# Patient Record
Sex: Male | Born: 1975 | Race: White | Hispanic: No | Marital: Married | Smoking: Current every day smoker
Health system: Southern US, Community
[De-identification: ages and names within clinical notes are randomized; demographics above are authoritative.]

## PROBLEM LIST (undated history)

## (undated) DIAGNOSIS — K7460 Unspecified cirrhosis of liver: Secondary | ICD-10-CM

## (undated) DIAGNOSIS — F1020 Alcohol dependence, uncomplicated: Secondary | ICD-10-CM

## (undated) DIAGNOSIS — I4891 Unspecified atrial fibrillation: Secondary | ICD-10-CM

## (undated) HISTORY — PX: PERITONEOCENTESIS: SHX417

---

## 2012-10-11 ENCOUNTER — Emergency Department: Payer: Self-pay | Admitting: Emergency Medicine

## 2012-10-11 LAB — COMPREHENSIVE METABOLIC PANEL
Alkaline Phosphatase: 156 U/L — ABNORMAL HIGH (ref 50–136)
Anion Gap: 9 (ref 7–16)
BUN: 6 mg/dL — ABNORMAL LOW (ref 7–18)
Bilirubin,Total: 0.4 mg/dL (ref 0.2–1.0)
Chloride: 105 mmol/L (ref 98–107)
Creatinine: 0.63 mg/dL (ref 0.60–1.30)
Osmolality: 272 (ref 275–301)
Potassium: 3.5 mmol/L (ref 3.5–5.1)
Total Protein: 7.5 g/dL (ref 6.4–8.2)

## 2012-10-11 LAB — ACETAMINOPHEN LEVEL: Acetaminophen: <2 ug/mL

## 2012-10-11 LAB — DRUG SCREEN, URINE
Amphetamines, Ur Screen: NEGATIVE (ref ?–1000)
Benzodiazepine, Ur Scrn: NEGATIVE (ref ?–200)
Cannabinoid 50 Ng, Ur ~~LOC~~: NEGATIVE (ref ?–50)
Cocaine Metabolite,Ur ~~LOC~~: NEGATIVE (ref ?–300)
MDMA (Ecstasy)Ur Screen: NEGATIVE (ref ?–500)
Methadone, Ur Screen: NEGATIVE (ref ?–300)
Tricyclic, Ur Screen: NEGATIVE (ref ?–1000)

## 2012-10-11 LAB — SALICYLATE LEVEL: Salicylates, Serum: 3.3 mg/dL — ABNORMAL HIGH

## 2012-10-11 LAB — CBC
HCT: 49.6 % (ref 40.0–52.0)
MCH: 34.8 pg — ABNORMAL HIGH (ref 26.0–34.0)
MCHC: 35 g/dL (ref 32.0–36.0)
Platelet: 259 10*3/uL (ref 150–440)
RDW: 14.8 % — ABNORMAL HIGH (ref 11.5–14.5)
WBC: 11.3 10*3/uL — ABNORMAL HIGH (ref 3.8–10.6)

## 2012-10-11 LAB — ETHANOL
Ethanol %: 0.046 % (ref 0.000–0.080)
Ethanol: 46 mg/dL

## 2012-10-11 LAB — TSH: Thyroid Stimulating Horm: 2.63 u[IU]/mL

## 2013-07-29 ENCOUNTER — Ambulatory Visit: Payer: Self-pay | Admitting: Surgery

## 2017-08-21 ENCOUNTER — Inpatient Hospital Stay (HOSPITAL_BASED_OUTPATIENT_CLINIC_OR_DEPARTMENT_OTHER)
Admission: EM | Admit: 2017-08-21 | Discharge: 2017-08-22 | DRG: 433 | Discharge status: Against Medical Advice | Payer: Medicaid Other | Attending: Family Medicine | Admitting: Family Medicine

## 2017-08-21 ENCOUNTER — Encounter (HOSPITAL_BASED_OUTPATIENT_CLINIC_OR_DEPARTMENT_OTHER): Payer: Self-pay | Admitting: Adult Health

## 2017-08-21 ENCOUNTER — Other Ambulatory Visit: Payer: Self-pay

## 2017-08-21 ENCOUNTER — Emergency Department (HOSPITAL_BASED_OUTPATIENT_CLINIC_OR_DEPARTMENT_OTHER): Payer: Medicaid Other

## 2017-08-21 DIAGNOSIS — K7031 Alcoholic cirrhosis of liver with ascites: Secondary | ICD-10-CM | POA: Diagnosis present

## 2017-08-21 DIAGNOSIS — Z590 Homelessness: Secondary | ICD-10-CM | POA: Diagnosis not present

## 2017-08-21 DIAGNOSIS — F17200 Nicotine dependence, unspecified, uncomplicated: Secondary | ICD-10-CM | POA: Diagnosis present

## 2017-08-21 DIAGNOSIS — I4891 Unspecified atrial fibrillation: Secondary | ICD-10-CM | POA: Diagnosis present

## 2017-08-21 DIAGNOSIS — R1084 Generalized abdominal pain: Secondary | ICD-10-CM

## 2017-08-21 DIAGNOSIS — R443 Hallucinations, unspecified: Secondary | ICD-10-CM | POA: Diagnosis present

## 2017-08-21 DIAGNOSIS — F10232 Alcohol dependence with withdrawal with perceptual disturbance: Secondary | ICD-10-CM | POA: Diagnosis present

## 2017-08-21 DIAGNOSIS — R109 Unspecified abdominal pain: Secondary | ICD-10-CM | POA: Diagnosis present

## 2017-08-21 DIAGNOSIS — F10932 Alcohol use, unspecified with withdrawal with perceptual disturbance: Secondary | ICD-10-CM

## 2017-08-21 DIAGNOSIS — F10951 Alcohol use, unspecified with alcohol-induced psychotic disorder with hallucinations: Secondary | ICD-10-CM

## 2017-08-21 DIAGNOSIS — E785 Hyperlipidemia, unspecified: Secondary | ICD-10-CM | POA: Diagnosis present

## 2017-08-21 DIAGNOSIS — E871 Hypo-osmolality and hyponatremia: Secondary | ICD-10-CM | POA: Diagnosis present

## 2017-08-21 DIAGNOSIS — Z7952 Long term (current) use of systemic steroids: Secondary | ICD-10-CM | POA: Diagnosis not present

## 2017-08-21 DIAGNOSIS — Z5321 Procedure and treatment not carried out due to patient leaving prior to being seen by health care provider: Secondary | ICD-10-CM | POA: Diagnosis present

## 2017-08-21 DIAGNOSIS — Z79899 Other long term (current) drug therapy: Secondary | ICD-10-CM

## 2017-08-21 DIAGNOSIS — D72829 Elevated white blood cell count, unspecified: Secondary | ICD-10-CM | POA: Diagnosis present

## 2017-08-21 DIAGNOSIS — K824 Cholesterolosis of gallbladder: Secondary | ICD-10-CM | POA: Diagnosis present

## 2017-08-21 DIAGNOSIS — R000 Tachycardia, unspecified: Secondary | ICD-10-CM

## 2017-08-21 DIAGNOSIS — R112 Nausea with vomiting, unspecified: Secondary | ICD-10-CM

## 2017-08-21 DIAGNOSIS — K709 Alcoholic liver disease, unspecified: Secondary | ICD-10-CM | POA: Diagnosis present

## 2017-08-21 DIAGNOSIS — R1011 Right upper quadrant pain: Secondary | ICD-10-CM

## 2017-08-21 HISTORY — DX: Unspecified cirrhosis of liver: K74.60

## 2017-08-21 HISTORY — DX: Unspecified atrial fibrillation: I48.91

## 2017-08-21 HISTORY — DX: Alcohol dependence, uncomplicated: F10.20

## 2017-08-21 LAB — URINALYSIS, ROUTINE W REFLEX MICROSCOPIC
Bilirubin Urine: NEGATIVE
Glucose, UA: NEGATIVE mg/dL
Hgb urine dipstick: NEGATIVE
Ketones, ur: NEGATIVE mg/dL
Leukocytes, UA: NEGATIVE
Nitrite: NEGATIVE
Protein, ur: NEGATIVE mg/dL
Specific Gravity, Urine: <1.005 — ABNORMAL LOW (ref 1.005–1.030)
pH: 6.5 (ref 5.0–8.0)

## 2017-08-21 LAB — CBC WITH DIFFERENTIAL/PLATELET
Basophils Absolute: 0 10*3/uL (ref 0.0–0.1)
Basophils Relative: 0 %
Eosinophils Absolute: 0.2 10*3/uL (ref 0.0–0.7)
Eosinophils Relative: 1 %
HCT: 40.2 % (ref 39.0–52.0)
Hemoglobin: 15.3 g/dL (ref 13.0–17.0)
Lymphocytes Relative: 9 %
Lymphs Abs: 2 10*3/uL (ref 0.7–4.0)
MCH: 34.7 pg — ABNORMAL HIGH (ref 26.0–34.0)
MCHC: 37.8 g/dL — ABNORMAL HIGH (ref 30.0–36.0)
MCV: 91.2 fL (ref 78.0–100.0)
Monocytes Absolute: 1.3 10*3/uL — ABNORMAL HIGH (ref 0.1–1.0)
Monocytes Relative: 6 %
Neutro Abs: 18.8 10*3/uL — ABNORMAL HIGH (ref 1.7–7.7)
Neutrophils Relative %: 84 %
Platelets: 243 10*3/uL (ref 150–400)
RBC: 4.41 MIL/uL (ref 4.22–5.81)
RDW: 13.8 % (ref 11.5–15.5)
WBC: 22.3 10*3/uL — ABNORMAL HIGH (ref 4.0–10.5)

## 2017-08-21 LAB — TSH: TSH: 2.725 u[IU]/mL (ref 0.350–4.500)

## 2017-08-21 LAB — COMPREHENSIVE METABOLIC PANEL
ALT: 26 U/L (ref 17–63)
AST: 69 U/L — ABNORMAL HIGH (ref 15–41)
Albumin: 2.9 g/dL — ABNORMAL LOW (ref 3.5–5.0)
Alkaline Phosphatase: 441 U/L — ABNORMAL HIGH (ref 38–126)
Anion gap: 14 (ref 5–15)
BUN: 6 mg/dL (ref 6–20)
CO2: 21 mmol/L — ABNORMAL LOW (ref 22–32)
Calcium: 7.9 mg/dL — ABNORMAL LOW (ref 8.9–10.3)
Chloride: 91 mmol/L — ABNORMAL LOW (ref 101–111)
Creatinine, Ser: 0.76 mg/dL (ref 0.61–1.24)
GFR calc Af Amer: >60 mL/min (ref >60)
GFR calc non Af Amer: >60 mL/min (ref >60)
Glucose, Bld: 156 mg/dL — ABNORMAL HIGH (ref 65–99)
Potassium: 3.5 mmol/L (ref 3.5–5.1)
Sodium: 126 mmol/L — ABNORMAL LOW (ref 135–145)
Total Bilirubin: 2.6 mg/dL — ABNORMAL HIGH (ref 0.3–1.2)
Total Protein: 6.6 g/dL (ref 6.5–8.1)

## 2017-08-21 LAB — RAPID URINE DRUG SCREEN, HOSP PERFORMED
Amphetamines: NOT DETECTED
Barbiturates: NOT DETECTED
Benzodiazepines: NOT DETECTED
Cocaine: NOT DETECTED
Opiates: NOT DETECTED
Tetrahydrocannabinol: NOT DETECTED

## 2017-08-21 LAB — PROTIME-INR
INR: 1.32
Prothrombin Time: 16.2 seconds — ABNORMAL HIGH (ref 11.4–15.2)

## 2017-08-21 LAB — LIPASE, BLOOD: Lipase: 34 U/L (ref 11–51)

## 2017-08-21 LAB — AMMONIA: Ammonia: 48 umol/L — ABNORMAL HIGH (ref 9–35)

## 2017-08-21 LAB — TROPONIN I: Troponin I: <0.03 ng/mL (ref <0.03)

## 2017-08-21 MED ORDER — CHLORDIAZEPOXIDE HCL 25 MG PO CAPS
50.0000 mg | ORAL_CAPSULE | Freq: Once | ORAL | Status: AC
  Administered 2017-08-21: 50 mg via ORAL
  Filled 2017-08-21: qty 2

## 2017-08-21 MED ORDER — VITAMIN B-1 100 MG PO TABS
100.0000 mg | ORAL_TABLET | Freq: Every day | ORAL | Status: DC
  Administered 2017-08-21 – 2017-08-22 (×2): 100 mg via ORAL
  Filled 2017-08-21 (×2): qty 1

## 2017-08-21 MED ORDER — TRAMADOL HCL 50 MG PO TABS: 50.0000 mg | ORAL_TABLET | Freq: Four times a day (QID) | ORAL | Status: DC | PRN

## 2017-08-21 MED ORDER — SODIUM CHLORIDE 0.9 % IV BOLUS
1000.0000 mL | Freq: Once | INTRAVENOUS | Status: AC
  Administered 2017-08-21: 1000 mL via INTRAVENOUS

## 2017-08-21 MED ORDER — MAGNESIUM OXIDE 400 MG PO TABS: 400.0000 mg | ORAL_TABLET | Freq: Every day | ORAL | Status: DC

## 2017-08-21 MED ORDER — SODIUM CHLORIDE 0.9% FLUSH
3.0000 mL | Freq: Two times a day (BID) | INTRAVENOUS | Status: DC
  Administered 2017-08-21 – 2017-08-22 (×2): 3 mL via INTRAVENOUS

## 2017-08-21 MED ORDER — ADULT MULTIVITAMIN W/MINERALS CH
1.0000 | ORAL_TABLET | Freq: Every day | ORAL | Status: DC
  Administered 2017-08-21 – 2017-08-22 (×2): 1 via ORAL
  Filled 2017-08-21 (×2): qty 1

## 2017-08-21 MED ORDER — THIAMINE HCL 100 MG/ML IJ SOLN: 100.0000 mg | Freq: Every day | INTRAMUSCULAR | Status: DC

## 2017-08-21 MED ORDER — LORAZEPAM 2 MG/ML IJ SOLN: 0.0000 mg | Freq: Two times a day (BID) | INTRAMUSCULAR | Status: DC

## 2017-08-21 MED ORDER — FUROSEMIDE 40 MG PO TABS
40.0000 mg | ORAL_TABLET | Freq: Every day | ORAL | Status: DC
  Administered 2017-08-22: 40 mg via ORAL
  Filled 2017-08-21 (×2): qty 1

## 2017-08-21 MED ORDER — MAGNESIUM OXIDE 400 (241.3 MG) MG PO TABS
400.0000 mg | ORAL_TABLET | Freq: Every day | ORAL | Status: DC
  Administered 2017-08-22: 400 mg via ORAL
  Filled 2017-08-21 (×2): qty 1

## 2017-08-21 MED ORDER — LORAZEPAM 2 MG/ML IJ SOLN: 2.0000 mg | Freq: Once | INTRAMUSCULAR | Status: DC

## 2017-08-21 MED ORDER — SODIUM CHLORIDE 0.9 % IV SOLN
INTRAVENOUS | Status: AC
  Administered 2017-08-21: 21:00:00 via INTRAVENOUS

## 2017-08-21 MED ORDER — LORAZEPAM 2 MG/ML IJ SOLN
0.0000 mg | Freq: Four times a day (QID) | INTRAMUSCULAR | Status: DC
  Administered 2017-08-21: 2 mg via INTRAVENOUS
  Filled 2017-08-21: qty 1

## 2017-08-21 MED ORDER — ONDANSETRON HCL 4 MG/2ML IJ SOLN: 4.0000 mg | Freq: Four times a day (QID) | INTRAMUSCULAR | Status: DC | PRN

## 2017-08-21 MED ORDER — LORAZEPAM 2 MG/ML IJ SOLN: 1.0000 mg | Freq: Four times a day (QID) | INTRAMUSCULAR | Status: DC | PRN

## 2017-08-21 MED ORDER — ONDANSETRON HCL 4 MG/2ML IJ SOLN
INTRAMUSCULAR | Status: AC
  Administered 2017-08-21: 4 mg
  Filled 2017-08-21: qty 2

## 2017-08-21 MED ORDER — FOLIC ACID 1 MG PO TABS
1.0000 mg | ORAL_TABLET | Freq: Every day | ORAL | Status: DC
  Administered 2017-08-21 – 2017-08-22 (×2): 1 mg via ORAL
  Filled 2017-08-21 (×2): qty 1

## 2017-08-21 MED ORDER — CHLORDIAZEPOXIDE HCL 25 MG PO CAPS: 25.0000 mg | ORAL_CAPSULE | Freq: Every day | ORAL | Status: DC

## 2017-08-21 MED ORDER — SODIUM CHLORIDE 0.9% FLUSH: 3.0000 mL | INTRAVENOUS | Status: DC | PRN

## 2017-08-21 MED ORDER — SPIRONOLACTONE 25 MG PO TABS
50.0000 mg | ORAL_TABLET | Freq: Every day | ORAL | Status: DC
  Administered 2017-08-22: 50 mg via ORAL
  Filled 2017-08-21 (×2): qty 2

## 2017-08-21 MED ORDER — SODIUM CHLORIDE 0.9 % IV SOLN: 250.0000 mL | INTRAVENOUS | Status: DC | PRN

## 2017-08-21 MED ORDER — THIAMINE HCL 100 MG/ML IJ SOLN
100.0000 mg | Freq: Every day | INTRAMUSCULAR | Status: DC
  Administered 2017-08-21: 100 mg via INTRAVENOUS
  Filled 2017-08-21: qty 2

## 2017-08-21 MED ORDER — VITAMIN B-1 100 MG PO TABS: 100.0000 mg | ORAL_TABLET | Freq: Every day | ORAL | Status: DC

## 2017-08-21 MED ORDER — PANTOPRAZOLE SODIUM 40 MG PO TBEC
40.0000 mg | DELAYED_RELEASE_TABLET | Freq: Two times a day (BID) | ORAL | Status: DC
  Administered 2017-08-21 – 2017-08-22 (×2): 40 mg via ORAL
  Filled 2017-08-21 (×2): qty 1

## 2017-08-21 MED ORDER — CARVEDILOL 6.25 MG PO TABS
6.2500 mg | ORAL_TABLET | Freq: Two times a day (BID) | ORAL | Status: DC
  Administered 2017-08-22: 6.25 mg via ORAL
  Filled 2017-08-21: qty 1

## 2017-08-21 MED ORDER — LORAZEPAM 1 MG PO TABS: 0.0000 mg | ORAL_TABLET | Freq: Two times a day (BID) | ORAL | Status: DC

## 2017-08-21 MED ORDER — LORAZEPAM 1 MG PO TABS: 1.0000 mg | ORAL_TABLET | Freq: Four times a day (QID) | ORAL | Status: DC | PRN

## 2017-08-21 MED ORDER — LORAZEPAM 2 MG/ML IJ SOLN: 0.0000 mg | Freq: Four times a day (QID) | INTRAMUSCULAR | Status: DC

## 2017-08-21 MED ORDER — LORAZEPAM 1 MG PO TABS: 0.0000 mg | ORAL_TABLET | Freq: Four times a day (QID) | ORAL | Status: DC

## 2017-08-21 MED ORDER — PREDNISONE 20 MG PO TABS: 20.0000 mg | ORAL_TABLET | Freq: Every day | ORAL | Status: DC

## 2017-08-21 MED ORDER — ENOXAPARIN SODIUM 40 MG/0.4ML ~~LOC~~ SOLN
40.0000 mg | SUBCUTANEOUS | Status: DC
  Filled 2017-08-21: qty 0.4

## 2017-08-21 NOTE — ED Triage Notes (Signed)
Presents with Cirrhosis and abdominal pain, nausea, vomiting and alcohol withdrawal. This began last night. He reprots he was recently dx with cirrhosis and has had ascites and varices. He had pericentesis 3 times, last one was 3 weeks ago. HE feels distended in his abdomen. HE is diaphoretic and shaky. His last drink was 2 am Wednesday. He usually drinks 10-15 drinks of liquor a day. [facility]. HR is 110, BP 148/107. Tremors noted. Endorses hallucinations.

## 2017-08-21 NOTE — H&P (Addendum)
TRH H&P   Patient Demographics:    Nirvan Laban, is a 42 y.o. male  MRN: [id number]   DOB - 1975/09/11  Admit Date - 08/21/2017  Outpatient Primary MD for the patient is Patient, No Pcp Per  Referring MD/NP/PA:  Eliezer Mccoy  Outpatient Specialists:      [facility]: [facility]  Chief Complaint  Patient presents with  . Alcohol Intoxication  . Abdominal Pain      HPI:    Corbet Hanley  is a 42 y.o. male, w ETOH abuse, Pafib (per pt), Cirrhosis, ascites apparently c/o abdominal pain over the past week, diffuse, worse today and therefore presented to ED. Pt notes that he had n/v, but no hematemesis.  Pt denies fever, chillls, diarrhea, constipation, brbpr, black stool.  Pt didn't try anything in particular for his pain.  Food apparently makes things slightly worse.   In ED,  Abdominal ultrasound IMPRESSION: Gallbladder sludge is noted without evidence of cholelithiasis. Small 3 mm gallbladder polyp is noted. No biliary dilatation is noted.  Findings consistent with hepatic cirrhosis.  Mild ascites.  Na 126, K 3.5, Bun 6, Creatinine 0.76,  Ast 69, Alt 26, alk phos 441, T. Bili 2.6  Wbc 22.3, Hgb 15.3, Plt 243 Lipase 34,  Trop <0.03 INR 1.32 Ammonia 48 UDS negative Urinalysis negative  CXR pending / ordered  Pt will be admitted for evaluation of abdominal pain, n/v, and ascites and leukocytosis.      Review of systems:    In addition to the HPI above,    No Fever-chills, No Headache, No changes with Vision or hearing, No problems swallowing food or Liquids, No Chest pain, Cough or Shortness of Breath, No Blood in stool or Urine, No dysuria, No new skin rashes or bruises, No new joints pains-aches,  No new weakness, tingling, numbness in any extremity, No recent weight gain or loss, No polyuria, polydypsia or polyphagia, No  significant Mental Stressors.  A full 10 point Review of Systems was done, except as stated above, all other Review of Systems were negative.   With Past History of the following :    Past Medical History:  Diagnosis Date  . Alcoholism ([place])   . Atrial fibrillation ([place])   . Cirrhosis [facility])       Past Surgical History:  Procedure Laterality Date  . PERITONEOCENTESIS        Social History:     Social History   Tobacco Use  . Smoking status: Current Every Day Smoker  . Smokeless tobacco: Current User  Substance Use Topics  . Alcohol use: Yes    Comment: 15 liquor drinks per day     Lives - previously with mother, now homeless and staying with friends  Mobility - walks by self   Family History :     Family History  Problem Relation Age  of Onset  . Atrial fibrillation Father       Home Medications:   Prior to Admission medications   Medication Sig Start Date End Date Taking? Authorizing Provider  carvedilol (COREG) 6.25 MG tablet Take 6.25 mg by mouth 2 (two) times daily with a meal.   Yes [provider]  ferrous sulfate 325 (65 FE) MG tablet Take by mouth. 07/12/17 07/12/18 Yes [provider]  furosemide (LASIX) 40 MG tablet Take by mouth. 07/12/17 07/12/18 Yes [provider]  magnesium oxide (MAG-OX) 400 MG tablet Take by mouth. 07/11/17 07/11/18 Yes [provider]  ondansetron (ZOFRAN) 4 MG tablet Take by mouth. 06/19/17  Yes [provider]  pantoprazole (PROTONIX) 40 MG tablet Take 40 mg by mouth daily.   Yes [provider]  pravastatin (PRAVACHOL) 10 MG tablet Take by mouth. 09/12/16 09/12/17 Yes [provider]  predniSONE (DELTASONE) 20 MG tablet Take 20 mg by mouth daily with breakfast.   Yes [provider]  spironolactone (ALDACTONE) 50 MG tablet Take by mouth. 07/12/17  Yes [provider]  metoprolol tartrate (LOPRESSOR) 25 MG tablet Take by mouth. 10/08/16   [provider]  niacin 500 MG tablet Take by mouth.    [provider]     Allergies:    No Known Allergies   Physical Exam:   Vitals  Blood pressure (!) 142/96, pulse (!) 104, temperature (!) 97.4 F (36.3 C), temperature source Oral, resp. rate 20, height _0  (1.854 m), weight 99.8 kg (220 lb), SpO2 92 %.   1. General  lying in bed in NAD,    2. Normal affect and insight, Not Suicidal or Homicidal, Awake Alert, Oriented X 3.  3. No F.N deficits, ALL C.Nerves Intact, Strength 5/5 all 4 extremities, Sensation intact all 4 extremities, Plantars down going.  4. Ears and Eyes appear Normal, Conjunctivae clear, PERRLA. Moist Oral Mucosa.  5. Supple Neck, No JVD, No cervical lymphadenopathy appriciated, No Carotid Bruits.  6. Symmetrical Chest wall movement, Good air movement bilaterally, CTAB.  7. RRR, No Gallops, Rubs or Murmurs, No Parasternal Heave.  8. Positive Bowel Sounds, Abdomen Soft, No tenderness, No organomegaly appriciated,No rebound -guarding or rigidity.  9.  No Cyanosis, Normal Skin Turgor, No Skin Rash or Bruise.  10. Good muscle tone,  joints appear normal , no effusions, Normal ROM.  11. No Palpable Lymph Nodes in Neck or Axillae  No palmar erythema, no asterixis, no caput medusa   Data Review:    CBC Recent Labs  Lab 08/21/17 1029  WBC 22.3*  HGB 15.3  HCT 40.2  PLT 243  MCV 91.2  MCH 34.7*  MCHC 37.8*  RDW 13.8  LYMPHSABS 2.0  MONOABS 1.3*  EOSABS 0.2  BASOSABS 0.0   ------------------------------------------------------------------------------------------------------------------  Chemistries  Recent Labs  Lab 08/21/17 1029  NA 126*  K 3.5  CL 91*  CO2 21*  GLUCOSE 156*  BUN 6  CREATININE 0.76  CALCIUM 7.9*  AST 69*  ALT 26  ALKPHOS 441*  BILITOT 2.6*   ------------------------------------------------------------------------------------------------------------------ estimated creatinine clearance is 149.6 mL/min  (by C-G formula based on SCr of 0.76 mg/dL). ------------------------------------------------------------------------------------------------------------------ No results for input(s): TSH, T4TOTAL, T3FREE, THYROIDAB in the last 72 hours.  Invalid input(s): FREET3  Coagulation profile Recent Labs  Lab 08/21/17 1038  INR 1.32   ------------------------------------------------------------------------------------------------------------------- No results for input(s): DDIMER in the last 72 hours. -------------------------------------------------------------------------------------------------------------------  Cardiac Enzymes Recent Labs  Lab 08/21/17 1029  TROPONINI <0.03   ------------------------------------------------------------------------------------------------------------------  No results found for: BNP   ---------------------------------------------------------------------------------------------------------------  Urinalysis    Component Value Date/Time   COLORURINE YELLOW 08/21/2017 [place] 08/21/2017 1343   LABSPEC <1.005 (L) 08/21/2017 1343   PHURINE 6.5 08/21/2017 1343   GLUCOSEU NEGATIVE 08/21/2017 1343   HGBUR NEGATIVE 08/21/2017 1343   BILIRUBINUR NEGATIVE 08/21/2017 1343   KETONESUR NEGATIVE 08/21/2017 1343   PROTEINUR NEGATIVE 08/21/2017 1343   NITRITE NEGATIVE 08/21/2017 1343   LEUKOCYTESUR NEGATIVE 08/21/2017 1343    ----------------------------------------------------------------------------------------------------------------   Imaging Results:    US Abdomen Limited Ruq  Result Date: 08/21/2017 CLINICAL DATA:  Right upper quadrant abdominal pain. EXAM: ULTRASOUND ABDOMEN LIMITED RIGHT UPPER QUADRANT COMPARISON:  None. FINDINGS: Gallbladder: No cholelithiasis is noted. 3 mm gallbladder polyp is noted. No significant gallbladder wall thickening is noted. No sonographic Murphy's sign is noted. Mild amount of sludge is noted  within gallbladder lumen. Common bile duct: Diameter: 4 mm which is within normal limits. Liver: No focal lesion identified. Heterogeneous echotexture of hepatic parenchyma is noted with nodular contours. Portal vein is patent on color Doppler imaging with normal direction of blood flow towards the liver. Mild ascites is noted. IMPRESSION: Gallbladder sludge is noted without evidence of cholelithiasis. Small 3 mm gallbladder polyp is noted. No biliary dilatation is noted. Findings consistent with hepatic cirrhosis.  Mild ascites. Electronically Signed   By: Marijo Conception, M.D.   On: 08/21/2017 14:27       Assessment & Plan:    Active Problems:   Alcoholic cirrhosis of liver (HCC)   Hyponatremia   Leukocytosis   Tachycardia    Leukocytosis ? Due to n/v CXR Pa and Lateral Repeat cbc in am  Abdominal pain (diffuse) Start protonix 62m po bid Check CT abd/ pelvis  N/v Start Zofran 488miv q6h prn protonix 4019mo bid  Abnormal liver function  Check acute hepatitis panel Check GGT Consider MRCP due to elevation in alk phos STOP Pravastatin  Hyponatremia Check serum osm, cortisol , tsh Check urine osm, urine sodium Hydrate gently w ns at 50 mL per hour  Check cmp in am  Tachycardia ? Due to etoh withdrawal Tele Trop I q6h x3 Check tsh Consider echo if persistent    ETOH abuse Librium 68m58m qday x 1 days then 25mg29mqday x 1 day CIWA  Hyperlipidemia STOP Pravastatin  Pafib ? Per pt Tele   DVT Prophylaxis Lovenox - SCDs   AM Labs Ordered, also please review Full Orders  Family Communication: Admission, patients condition and plan of care including tests being ordered have been discussed with the patient  who indicate understanding and agree with the plan and Code Status.  Code Status  FULL CODE  Likely DC to  home  Condition GUARDED    Consults called: none  Admission status: inpatient   Time spent in minutes : 45   JamesJani Gravelon 08/21/2017 at  7:53 PM  Between 7am to 7pm - Pager - [phone] 7pm go to [email or web address] - password [email or web address] - Office  [phone]

## 2017-08-21 NOTE — ED Notes (Signed)
Gave patient sprite and cheese and crackers; no acute distress noted.

## 2017-08-21 NOTE — ED Provider Notes (Signed)
[facility] EMERGENCY DEPARTMENT Provider Note   CSN: [id number] Arrival date & time: 08/21/17  1001     History   Chief Complaint Chief Complaint  Patient presents with  . Alcohol Intoxication  . Abdominal Pain    HPI Kristopher Barker is a 42 y.o. male with history of alcoholism and alcoholic cirrhosis who presents with a 1 day history of vomiting and abdominal cramping.  Patient also has had tremors and hallucinations.  He reports that he has had seizures from withdrawal in the past, however none recently.  Patient has not been able to keep fluids down since yesterday.  He has had some associated shortness of breath.  He denies chest pain.  He is not currently taking Librium.  Patient's last drink was over 48 hours ago.  He is reportedly trying to cut back.  Patient has had paracentesis in the past.  He has noticed some abdominal distention worsening.  HPI  Past Medical History:  Diagnosis Date  . Alcoholism ([place])   . Cirrhosis ([place])     There are no active problems to display for this patient.   Past Surgical History:  Procedure Laterality Date  . PERITONEOCENTESIS          Home Medications    Prior to Admission medications   Medication Sig Start Date End Date Taking? Authorizing Provider  carvedilol (COREG) 6.25 MG tablet Take 6.25 mg by mouth 2 (two) times daily with a meal.   Yes [provider]  ferrous sulfate 325 (65 FE) MG tablet Take by mouth. 07/12/17 07/12/18 Yes [provider]  furosemide (LASIX) 40 MG tablet Take by mouth. 07/12/17 07/12/18 Yes [provider]  magnesium oxide (MAG-OX) 400 MG tablet Take by mouth. 07/11/17 07/11/18 Yes [provider]  ondansetron (ZOFRAN) 4 MG tablet Take by mouth. 06/19/17  Yes [provider]  pantoprazole (PROTONIX) 40 MG tablet Take 40 mg by mouth daily.   Yes [provider]  pravastatin (PRAVACHOL) 10 MG tablet Take by mouth. 09/12/16 09/12/17 Yes [provider]  predniSONE (DELTASONE) 20 MG tablet Take 20 mg by mouth daily with breakfast.   Yes [provider]  spironolactone (ALDACTONE) 50 MG tablet Take by mouth. 07/12/17  Yes [provider]  metoprolol tartrate (LOPRESSOR) 25 MG tablet Take by mouth. 10/08/16   [provider]  niacin 500 MG tablet Take by mouth.    [provider]    Family History History reviewed. No pertinent family history.  Social History Social History   Tobacco Use  . Smoking status: Current Every Day Smoker  . Smokeless tobacco: Current User  Substance Use Topics  . Alcohol use: Yes    Comment: 15 liquor drinks per day  . Drug use: Never     Allergies   Patient has no known allergies.   Review of Systems Review of Systems  Constitutional: Negative for chills and fever.  HENT: Negative for facial swelling and sore throat.   Respiratory: Positive for shortness of breath.   Cardiovascular: Negative for chest pain.  Gastrointestinal: Positive for abdominal pain, nausea and vomiting. Negative for diarrhea.  Genitourinary: Negative for dysuria.  Musculoskeletal: Negative for back pain.  Skin: Negative for rash and wound.  Neurological: Negative for seizures and headaches.  Psychiatric/Behavioral: Positive for hallucinations. The patient is not nervous/anxious.      Physical Exam Updated Vital Signs BP 134/88 (BP Location: Right Arm)   Pulse 96   Temp 98.4  F (36.9 C) (Oral)   Resp (!) 24   Ht _0  (1.854 m)   Wt 99.8 kg (220 lb)   SpO2 97%   BMI 29.03 kg/m   Physical Exam  Constitutional: He appears well-developed and well-nourished. No distress.  HENT:  Head: Normocephalic and atraumatic.  Mouth/Throat: Oropharynx is clear and moist. No oropharyngeal exudate.  Eyes: Pupils are equal, round, and reactive to light. Conjunctivae are normal. Right eye exhibits no discharge. Left eye exhibits no discharge. No scleral icterus.  Neck: Normal range  of motion. Neck supple. No thyromegaly present.  Cardiovascular: Normal rate, regular rhythm, normal heart sounds and intact distal pulses. Exam reveals no gallop and no friction rub.  No murmur heard. Pulmonary/Chest: Effort normal and breath sounds normal. No stridor. No respiratory distress. He has no wheezes. He has no rales.  Abdominal: Soft. Bowel sounds are normal. He exhibits no distension. There is no tenderness. There is no rebound and no guarding.  Musculoskeletal: He exhibits no edema.  Lymphadenopathy:    He has no cervical adenopathy.  Neurological: He is alert. Coordination normal.  Skin: Skin is warm and dry. No rash noted. He is not diaphoretic. No pallor.  Psychiatric: He has a normal mood and affect. He is actively hallucinating.  Nursing note and vitals reviewed.    ED Treatments / Results  Labs (all labs ordered are listed, but only abnormal results are displayed) Labs Reviewed  URINALYSIS, ROUTINE W REFLEX MICROSCOPIC - Abnormal; Notable for the following components:      Result Value   Specific Gravity, Urine <1.005 (*)    All other components within normal limits  COMPREHENSIVE METABOLIC PANEL - Abnormal; Notable for the following components:   Sodium 126 (*)    Chloride 91 (*)    CO2 21 (*)    Glucose, Bld 156 (*)    Calcium 7.9 (*)    Albumin 2.9 (*)    AST 69 (*)    Alkaline Phosphatase 441 (*)    Total Bilirubin 2.6 (*)    All other components within normal limits  CBC WITH DIFFERENTIAL/PLATELET - Abnormal; Notable for the following components:   WBC 22.3 (*)    MCH 34.7 (*)    MCHC 37.8 (*)    Neutro Abs 18.8 (*)    Monocytes Absolute 1.3 (*)    All other components within normal limits  PROTIME-INR - Abnormal; Notable for the following components:   Prothrombin Time 16.2 (*)    All other components within normal limits  AMMONIA - Abnormal; Notable for the following components:   Ammonia 48 (*)    All other components within normal limits    LIPASE, BLOOD  TROPONIN I  RAPID URINE DRUG SCREEN, HOSP PERFORMED    EKG EKG Interpretation  Date/Time:  Friday August 21 2017 10:56:55 EDT Ventricular Rate:  87 PR Interval:    QRS Duration: 106 QT Interval:  410 QTC Calculation: 494 R Axis:   90 Text Interpretation:  Sinus rhythm Atrial premature complex Borderline right axis deviation ST elev, probable normal early repol pattern Borderline prolonged QT interval When compared to prior, no signifiacnt changes seen.  No STEMI Confirmed by Antony Blackbird [phone]) on 08/21/2017 11:45:39 AM   Radiology US Abdomen Limited Ruq  Result Date: 08/21/2017 CLINICAL DATA:  Right upper quadrant abdominal pain. EXAM: ULTRASOUND ABDOMEN LIMITED RIGHT UPPER QUADRANT COMPARISON:  None. FINDINGS: Gallbladder: No cholelithiasis is noted. 3 mm gallbladder polyp is noted. No significant gallbladder wall thickening  is noted. No sonographic Murphy's sign is noted. Mild amount of sludge is noted within gallbladder lumen. Common bile duct: Diameter: 4 mm which is within normal limits. Liver: No focal lesion identified. Heterogeneous echotexture of hepatic parenchyma is noted with nodular contours. Portal vein is patent on color Doppler imaging with normal direction of blood flow towards the liver. Mild ascites is noted. IMPRESSION: Gallbladder sludge is noted without evidence of cholelithiasis. Small 3 mm gallbladder polyp is noted. No biliary dilatation is noted. Findings consistent with hepatic cirrhosis.  Mild ascites. Electronically Signed   By: Marijo Conception, M.D.   On: 08/21/2017 14:27    Procedures Procedures (including critical care time)  Medications Ordered in ED Medications  LORazepam (ATIVAN) injection 0-4 mg (2 mg Intravenous Given 08/21/17 1034)    Or  LORazepam (ATIVAN) tablet 0-4 mg ( Oral See Alternative 08/21/17 1034)  LORazepam (ATIVAN) injection 0-4 mg (has no administration in time range)    Or  LORazepam (ATIVAN) tablet 0-4 mg (has no  administration in time range)  thiamine (VITAMIN B-1) tablet 100 mg ( Oral See Alternative 08/21/17 1059)    Or  thiamine (B-1) injection 100 mg (100 mg Intravenous Given 08/21/17 1059)  LORazepam (ATIVAN) injection 2 mg (2 mg Intravenous Not Given 08/21/17 1058)  ondansetron (ZOFRAN) 4 MG/2ML injection (4 mg  Given 08/21/17 1034)  sodium chloride 0.9 % bolus 1,000 mL (0 mLs Intravenous Stopped 08/21/17 1146)     Initial Impression / Assessment and Plan / ED Course  I have reviewed the triage vital signs and the nursing notes.  Pertinent labs & imaging results that were available during my care of the patient were reviewed by me and considered in my medical decision making (see chart for details).     Patient with acute alcohol withdrawal.  Patient endorsing hallucinations.  Acute onset generalized abdominal pain and cramping with nausea and vomiting.  CBC shows leukocytosis of 22.3.  Per chart review, but this does seem to be typical for patient.  CMP shows sodium 126, chloride 91, AST 69, alk phos 441, total bilirubin 2.6, albumin 2.9.  Lipase 34.  Negative troponin.  RUQ Ultrasound shows gallbladder sludge without evidence of cholelithiasis, small 3 mm gallbladder polyp; findings consistent with hepatic cirrhosis, mild ascites.  Patient initiated with [facility] protocol and patient has been stable.  Considering hallucinations with acute withdrawal, will admit for further management.  I discussed patient case with Dr. Horris Latino at [facility] who accepts the patient.  I appreciate her assistance with the patient. I discussed patient case with my attending, Dr. Sherry Ruffing, who guided the patient's management and agrees with plan.   Final Clinical Impressions(s) / ED Diagnoses   Final diagnoses:  RUQ pain  Alcohol withdrawal syndrome with perceptual disturbance (HCC)  Generalized abdominal pain  Non-intractable vomiting with nausea, unspecified vomiting type  Hallucinations due to alcohol [facility])     ED Discharge Orders    None       Frederica Kuster, PA-C 08/21/17 1532    Tegeler, Gwenyth Allegra, MD 08/21/17 1547

## 2017-08-21 NOTE — ED Notes (Signed)
[facility] arrived to transport pt to [facility].  

## 2017-08-22 ENCOUNTER — Inpatient Hospital Stay (HOSPITAL_COMMUNITY): Payer: Medicaid Other

## 2017-08-22 DIAGNOSIS — R1084 Generalized abdominal pain: Secondary | ICD-10-CM

## 2017-08-22 DIAGNOSIS — K7031 Alcoholic cirrhosis of liver with ascites: Principal | ICD-10-CM

## 2017-08-22 DIAGNOSIS — R109 Unspecified abdominal pain: Secondary | ICD-10-CM

## 2017-08-22 DIAGNOSIS — F10232 Alcohol dependence with withdrawal with perceptual disturbance: Secondary | ICD-10-CM

## 2017-08-22 DIAGNOSIS — E871 Hypo-osmolality and hyponatremia: Secondary | ICD-10-CM

## 2017-08-22 DIAGNOSIS — D72829 Elevated white blood cell count, unspecified: Secondary | ICD-10-CM

## 2017-08-22 LAB — CORTISOL: CORTISOL PLASMA: 9.5 ug/dL

## 2017-08-22 LAB — OSMOLALITY: Osmolality: 278 mOsm/kg (ref 275–295)

## 2017-08-22 LAB — COMPREHENSIVE METABOLIC PANEL
ALK PHOS: 387 U/L — AB (ref 38–126)
ALT: 23 U/L (ref 17–63)
ANION GAP: 11 (ref 5–15)
AST: 53 U/L — ABNORMAL HIGH (ref 15–41)
Albumin: 2.4 g/dL — ABNORMAL LOW (ref 3.5–5.0)
BUN: 5 mg/dL — ABNORMAL LOW (ref 6–20)
CALCIUM: 8 mg/dL — AB (ref 8.9–10.3)
CHLORIDE: 101 mmol/L (ref 101–111)
CO2: 23 mmol/L (ref 22–32)
CREATININE: 0.78 mg/dL (ref 0.61–1.24)
Glucose, Bld: 92 mg/dL (ref 65–99)
Potassium: 3.4 mmol/L — ABNORMAL LOW (ref 3.5–5.1)
SODIUM: 135 mmol/L (ref 135–145)
Total Bilirubin: 2.5 mg/dL — ABNORMAL HIGH (ref 0.3–1.2)
Total Protein: 5.9 g/dL — ABNORMAL LOW (ref 6.5–8.1)

## 2017-08-22 LAB — GAMMA GT: GGT: 346 U/L — AB (ref 7–50)

## 2017-08-22 LAB — CBC
HCT: 39.2 % (ref 39.0–52.0)
HEMOGLOBIN: 13.5 g/dL (ref 13.0–17.0)
MCH: 33.7 pg (ref 26.0–34.0)
MCHC: 34.4 g/dL (ref 30.0–36.0)
MCV: 97.8 fL (ref 78.0–100.0)
Platelets: 164 10*3/uL (ref 150–400)
RBC: 4.01 MIL/uL — AB (ref 4.22–5.81)
RDW: 14.5 % (ref 11.5–15.5)
WBC: 14.3 10*3/uL — AB (ref 4.0–10.5)

## 2017-08-22 LAB — TROPONIN I
Troponin I: <0.03 ng/mL (ref <0.03)
Troponin I: <0.03 ng/mL (ref <0.03)

## 2017-08-22 LAB — HIV ANTIBODY (ROUTINE TESTING W REFLEX): HIV SCREEN 4TH GENERATION: NONREACTIVE

## 2017-08-22 MED ORDER — POTASSIUM CHLORIDE CRYS ER 20 MEQ PO TBCR
40.0000 meq | EXTENDED_RELEASE_TABLET | Freq: Once | ORAL | Status: AC
  Administered 2017-08-22: 40 meq via ORAL
  Filled 2017-08-22: qty 2

## 2017-08-22 MED ORDER — IOPAMIDOL (ISOVUE-300) INJECTION 61%
100.0000 mL | Freq: Once | INTRAVENOUS | Status: AC | PRN
  Administered 2017-08-22: 100 mL via INTRAVENOUS

## 2017-08-22 MED ORDER — BOOST / RESOURCE BREEZE PO LIQD CUSTOM: 1.0000 | Freq: Three times a day (TID) | ORAL | Status: DC

## 2017-08-22 MED ORDER — IOPAMIDOL (ISOVUE-300) INJECTION 61%
INTRAVENOUS | Status: AC
  Filled 2017-08-22: qty 30

## 2017-08-22 NOTE — Progress Notes (Signed)
Patient spoke with MD regarding test results and being discharged. All risks were explained to pt by MD. Nurse again explained risk and encouraged pt to stay for further work-up. He insisted he wanted to "leave, look over papers and get another opinion on what to do". AMA paper was signed by pt.Melton Alarana A Mamye Bolds, RN

## 2017-08-22 NOTE — Progress Notes (Signed)
[facility]  PROGRESS NOTE  Kristopher Barker OYD:[id number] DOB: December 28, 1975 DOA: 08/21/2017 PCP: Patient, No Pcp Per   Brief HPI:   42 y.o. male, w ETOH abuse, Pafib (per pt), Cirrhosis, ascites apparently c/o abdominal pain over the past week, diffuse, worse today and therefore presented to ED. Pt notes that he had n/v, but no hematemesis.  Pt denies fever, chillls, diarrhea, constipation, brbpr, black stool.  Pt didn't try anything in particular for his pain.  Food apparently makes things slightly worse.     Subjective   Patient seen and examined, denies abdominal pain.  No nausea vomiting.  WBC has improved to 14,000, he is afebrile.  AST is 53, ALT 23   Assessment/Plan:     1. Abdominal pain-resolved, CT abdomen pelvis has been ordered.  He does have elevated alk phos 387,  AST 53, ALT 23, total bilirubin 2.5. GGT is elevated 346.Patient does have history of liver cirrhosis, as seen on the abdominal ultrasound.  Will follow CT scan results.Consider MRCP based on the CT scan results 2. Leukocytosis-patient had elevated WBC 22.3 on admission, which is improved to 14.3.  Patient continues to be afebrile.  UA was negative 3. Hyponatremia-resolved, patient came with sodium of 126, it has improved to 135 with IV fluids. 4. Alcohol abuse-no signs or symptoms of withdrawal, continue CIWA protocol 5. Hyperlipidemia-pravastatin has been discontinued.    DVT prophylaxis: SCDs  Code Status: Full code  Family Communication: No family at bedside  Disposition Plan: likely home when medically ready for discharge   Consultants:  None  Procedures:  None   Antibiotics:   Anti-infectives (From admission, onward)   None       Objective   Vitals:   08/21/17 1824 08/21/17 2158 08/22/17 0641 08/22/17 0644  BP: (!) 142/96 (!) 130/95 123/84 123/84  Pulse: (!) 104 98 92 98  Resp: '20 18 20 20  ' Temp: (!) 97.4 F (36.3 C) 98.3 F (36.8 C) 98.2 F (36.8 C) 98.2 F (36.8  C)  TempSrc: Oral  Oral Oral  SpO2: 92% 98% 98% 93%  Weight:      Height:        Intake/Output Summary (Last 24 hours) at 08/22/2017 1401 Last data filed at 08/22/2017 6720 Gross per 24 hour  Intake 1352.5 ml  Output 100 ml  Net 1252.5 ml   Filed Weights   08/21/17 1013  Weight: 99.8 kg (220 lb)     Physical Examination:    General: Appears in no acute distress  Cardiovascular: S1-S2, regular, no murmurs auscultated  Respiratory: Clear to auscultation bilaterally, no wheezing or crackles  Abdomen: Soft, nontender, no organomegaly  Extremities: No edema noted in the lower extremities  Neurologic: Alert, oriented x 3, no focal deficit noted at this time     Data Reviewed: I have personally reviewed following labs and imaging studies  CBG: No results for input(s): GLUCAP in the last 168 hours.  CBC: Recent Labs  Lab 08/21/17 1029 08/22/17 0158  WBC 22.3* 14.3*  NEUTROABS 18.8*  --   HGB 15.3 13.5  HCT 40.2 39.2  MCV 91.2 97.8  PLT 243 947    Basic Metabolic Panel: Recent Labs  Lab 08/21/17 1029 08/22/17 0158  NA 126* 135  K 3.5 3.4*  CL 91* 101  CO2 21* 23  GLUCOSE 156* 92  BUN 6 5*  CREATININE 0.76 0.78  CALCIUM 7.9* 8.0*    No results found for this or any previous visit (from  the past 240 hour(s)).   Liver Function Tests: Recent Labs  Lab 08/21/17 1029 08/22/17 0158  AST 69* 53*  ALT 26 23  ALKPHOS 441* 387*  BILITOT 2.6* 2.5*  PROT 6.6 5.9*  ALBUMIN 2.9* 2.4*   Recent Labs  Lab 08/21/17 1029  LIPASE 34   Recent Labs  Lab 08/21/17 1100  AMMONIA 48*    Cardiac Enzymes: Recent Labs  Lab 08/21/17 1029 08/21/17 2022 08/22/17 0158 08/22/17 0827  TROPONINI <0.03 <0.03 <0.03 <0.03   BNP (last 3 results) No results for input(s): BNP in the last 8760 hours.  ProBNP (last 3 results) No results for input(s): PROBNP in the last 8760 hours.    Studies: US Abdomen Limited Ruq  Result Date: 08/21/2017 CLINICAL DATA:   Right upper quadrant abdominal pain. EXAM: ULTRASOUND ABDOMEN LIMITED RIGHT UPPER QUADRANT COMPARISON:  None. FINDINGS: Gallbladder: No cholelithiasis is noted. 3 mm gallbladder polyp is noted. No significant gallbladder wall thickening is noted. No sonographic Murphy's sign is noted. Mild amount of sludge is noted within gallbladder lumen. Common bile duct: Diameter: 4 mm which is within normal limits. Liver: No focal lesion identified. Heterogeneous echotexture of hepatic parenchyma is noted with nodular contours. Portal vein is patent on color Doppler imaging with normal direction of blood flow towards the liver. Mild ascites is noted. IMPRESSION: Gallbladder sludge is noted without evidence of cholelithiasis. Small 3 mm gallbladder polyp is noted. No biliary dilatation is noted. Findings consistent with hepatic cirrhosis.  Mild ascites. Electronically Signed   By: Marijo Conception, M.D.   On: 08/21/2017 14:27    Scheduled Meds: . carvedilol  6.25 mg Oral BID WC  . chlordiazePOXIDE  25 mg Oral Q2000  . enoxaparin (LOVENOX) injection  40 mg Subcutaneous Q24H  . feeding supplement  1 Container Oral TID BM  . folic acid  1 mg Oral Daily  . furosemide  40 mg Oral Daily  . iopamidol      . LORazepam  0-4 mg Intravenous Q6H   Followed by  . [START ON 08/23/2017] LORazepam  0-4 mg Intravenous Q12H  . magnesium oxide  400 mg Oral Daily  . multivitamin with minerals  1 tablet Oral Daily  . pantoprazole  40 mg Oral BID  . sodium chloride flush  3 mL Intravenous Q12H  . spironolactone  50 mg Oral Daily  . thiamine  100 mg Oral Daily   Or  . thiamine  100 mg Intravenous Daily      Time spent: 25 min  [facility] Pager [phone]. If 7PM-7AM, please contact night-coverage at [email or web address], Office  [phone]  password [email or web address]  08/22/2017, 2:01 PM  LOS: 1 day

## 2017-08-22 NOTE — Plan of Care (Signed)

## 2017-08-22 NOTE — Discharge Summary (Signed)
Physician Discharge Summary  Kristopher Barker JWJ:[id number] DOB: November 20, 1975 DOA: 08/21/2017  PCP: Patient, No Pcp Per  Admit date: 08/21/2017 Discharge date: 08/22/2017  Time spent: 25* minutes  1. Patient left AMA   Discharge Diagnoses:  Active Problems:   Alcoholic cirrhosis of liver (HCC)   Hyponatremia   Leukocytosis   Tachycardia   Non-intractable vomiting with nausea   Abdominal pain     Filed Weights   08/21/17 1013  Weight: 99.8 kg (220 lb)    History of present illness:  41 y.o.male,w ETOH abuse, Pafib (per pt), Cirrhosis, ascites apparently c/o abdominal pain over the past week, diffuse, worse today and therefore presented to ED. Pt notes that he had n/v, but no hematemesis. Pt denies fever, chillls, diarrhea, constipation, brbpr, black stool. Pt didn't try anything in particular for his pain. Food apparently makes things slightly worse.     Hospital Course:  Reviewed CT scan results which showed that patient may have cholecystitis.  Called and discussed the results with the patient, he does not want to stay in the hospital for further evaluation including consultation with GI and surgery.  I explained to the patient that he may have cholecystitis and will need a surgical evaluation, if he leaves without getting evaluation he can die from sepsis.  Patient at this time wants to leave AMA.      Discharge Exam: Vitals:   08/22/17 0644 08/22/17 1446  BP: 123/84 116/83  Pulse: 98 82  Resp: 20 16  Temp: 98.2 F (36.8 C) 97.9 F (36.6 C)  SpO2: 93% 95%      Discharge Instructions     No Known Allergies    The results of significant diagnostics from this hospitalization (including imaging, microbiology, ancillary and laboratory) are listed below for reference.    Significant Diagnostic Studies: Ct Abdomen Pelvis W Contrast  Result Date: 08/22/2017 CLINICAL DATA:  Diffuse abdominal pain for 1 week, worsening today. History of cirrhosis,  ascites, atrial fibrillation. EXAM: CT ABDOMEN AND PELVIS WITH CONTRAST TECHNIQUE: Multidetector CT imaging of the abdomen and pelvis was performed using the standard protocol following bolus administration of intravenous contrast. CONTRAST:  100mL ISOVUE-300 IOPAMIDOL (ISOVUE-300) INJECTION 61% COMPARISON:  Abdominal ultrasound August 21, 2016 FINDINGS: LOWER CHEST: RIGHT lower lobe atelectasis. Included heart size is normal. Mild coronary artery calcifications. No pericardial effusion. HEPATOBILIARY: Nodular hypodense liver with heterogeneous parenchyma, no discrete mass. No intrahepatic biliary dilatation. Patent main portal vein. Gallbladder wall thickening with punctate mural calcification. PANCREAS: Normal. SPLEEN: Splenomegaly, 14.2 cm in cranial caudad dimension. ADRENALS/URINARY TRACT: Kidneys are orthotopic, demonstrating symmetric enhancement. No nephrolithiasis, hydronephrosis or solid renal masses. The unopacified ureters are normal in course and caliber. Delayed imaging through the kidneys demonstrates symmetric prompt contrast excretion within the proximal urinary collecting system. Urinary bladder is partially distended and unremarkable. Normal adrenal glands. STOMACH/BOWEL: The stomach, small and large bowel are normal in course and caliber without inflammatory changes. Normal appendix. VASCULAR/LYMPHATIC: Aortoiliac vessels are normal in course and caliber. Moderate calcific atherosclerosis. No lymphadenopathy by CT size criteria. REPRODUCTIVE: Prostate size is normal. Seminal vesicle calcifications seen with diabetes. OTHER: Small to moderate volume ascites without focal fluid collection or intraperitoneal free air. MUSCULOSKELETAL: Nonacute. Small fat containing inguinal hernias. Small fat and fluid containing umbilical hernia. IMPRESSION: 1. Cirrhosis and sequelae of portal hypertension including small to moderate volume ascites. 2. Mild pericholecystic fluid and gallbladder wall thickening seen  with portal hypertension and/or cholecystitis. Aortic Atherosclerosis (ICD10-I70.0). Electronically Signed   By:  Awilda Metro M.D.   On: 08/22/2017 15:04   US Abdomen Limited Ruq  Result Date: 08/21/2017 CLINICAL DATA:  Right upper quadrant abdominal pain. EXAM: ULTRASOUND ABDOMEN LIMITED RIGHT UPPER QUADRANT COMPARISON:  None. FINDINGS: Gallbladder: No cholelithiasis is noted. 3 mm gallbladder polyp is noted. No significant gallbladder wall thickening is noted. No sonographic Murphy's sign is noted. Mild amount of sludge is noted within gallbladder lumen. Common bile duct: Diameter: 4 mm which is within normal limits. Liver: No focal lesion identified. Heterogeneous echotexture of hepatic parenchyma is noted with nodular contours. Portal vein is patent on color Doppler imaging with normal direction of blood flow towards the liver. Mild ascites is noted. IMPRESSION: Gallbladder sludge is noted without evidence of cholelithiasis. Small 3 mm gallbladder polyp is noted. No biliary dilatation is noted. Findings consistent with hepatic cirrhosis.  Mild ascites. Electronically Signed   By: Lupita Raider, M.D.   On: 08/21/2017 14:27    Microbiology: No results found for this or any previous visit (from the past 240 hour(s)).   Labs: Basic Metabolic Panel: Recent Labs  Lab 08/21/17 1029 08/22/17 0158  NA 126* 135  K 3.5 3.4*  CL 91* 101  CO2 21* 23  GLUCOSE 156* 92  BUN 6 5*  CREATININE 0.76 0.78  CALCIUM 7.9* 8.0*   Liver Function Tests: Recent Labs  Lab 08/21/17 1029 08/22/17 0158  AST 69* 53*  ALT 26 23  ALKPHOS 441* 387*  BILITOT 2.6* 2.5*  PROT 6.6 5.9*  ALBUMIN 2.9* 2.4*   Recent Labs  Lab 08/21/17 1029  LIPASE 34   Recent Labs  Lab 08/21/17 1100  AMMONIA 48*   CBC: Recent Labs  Lab 08/21/17 1029 08/22/17 0158  WBC 22.3* 14.3*  NEUTROABS 18.8*  --   HGB 15.3 13.5  HCT 40.2 39.2  MCV 91.2 97.8  PLT 243 164   Cardiac Enzymes: Recent Labs  Lab  08/21/17 1029 08/21/17 2022 08/22/17 0158 08/22/17 0827  TROPONINI <0.03 <0.03 <0.03 <0.03   BNP: BNP (last 3 results) No results for input(s): BNP in the last 8760 hours.  ProBNP (last 3 results) No results for input(s): PROBNP in the last 8760 hours.  CBG: No results for input(s): GLUCAP in the last 168 hours.     Signed:  Meredeth Ide MD.  [facility] 08/22/2017, 5:32 PM

## 2017-08-22 NOTE — Progress Notes (Signed)
Reviewed CT scan results which showed that patient may have cholecystitis.  Called and discussed the results with the patient, he does not want to stay in the hospital for further evaluation including consultation with GI and surgery.  I explained to the patient that he may have cholecystitis and will need a surgical evaluation, if he leaves without getting evaluation he can die from sepsis.  Patient at this time wants to leave AMA.  This was conveyed to the nurse Annabelle Harmanana.

## 2017-08-22 NOTE — Progress Notes (Signed)
Initial Nutrition Assessment  INTERVENTION:   Provide Boost Breeze po TID, each supplement provides 250 kcal and 9 grams of protein  NUTRITION DIAGNOSIS:   Increased nutrient needs related to (ETOH cirrhosis) as evidenced by estimated needs.  GOAL:   Patient will meet greater than or equal to 90% of their needs  MONITOR:   PO intake, Supplement acceptance, Labs, Weight trends, I & O's  REASON FOR ASSESSMENT:   Malnutrition Screening Tool    ASSESSMENT:    42 y.o. male, w ETOH abuse, Pafib (per pt), Cirrhosis, ascites apparently c/o abdominal pain over the past week, diffuse, worse today and therefore presented to ED.   Patient reports homelessness. Having N/V x 1 day  from withdrawal from ETOH consumption. Pt reports drinking 10-15 liquor drinks daily. Currently on clear liquids. Would benefit from protein supplementation given increased needs. Will order Boost Breeze.   Per chart review, pt reports weight loss but none shown in records.   Medications: Folic acid tablet daily, Lasix tablet daily, MAG-OX tablet daily, Multivitamin with minerals daily, Protonix tablet BID, K-DUR tablet once, Thiamine tablet daily Labs reviewed: Low K   NUTRITION - FOCUSED PHYSICAL EXAM:  Nutrition focused physical exam shows no sign of depletion of muscle mass or body fat.  Diet Order:  Diet clear liquid Room service appropriate? Yes; Fluid consistency: Thin  EDUCATION NEEDS:   No education needs have been identified at this time  Skin:  Skin Assessment: Reviewed RN Assessment  Last BM:  4/19  Height:   Ht Readings from Last 1 Encounters:  08/21/17 6\' 1"  (1.854 m)    Weight:   Wt Readings from Last 1 Encounters:  08/21/17 220 lb (99.8 kg)    Ideal Body Weight:  83.6 kg  BMI:  Body mass index is 29.03 kg/m.  Estimated Nutritional Needs:   Kcal:  2500-2700  Protein:  115-125g  Fluid:  2.5L/day  Tilda FrancoLindsey Jaishon Krisher, MS, RD, LDN [facility]  Dietitian Pager: [phone] After Hours Pager: [phone]

## 2017-08-24 LAB — HEPATITIS PANEL, ACUTE
HEP B S AG: NEGATIVE
Hep A IgM: NEGATIVE
Hep B C IgM: NEGATIVE

## 2017-11-29 ENCOUNTER — Inpatient Hospital Stay (HOSPITAL_COMMUNITY)
Admission: EM | Admit: 2017-11-29 | Discharge: 2017-12-01 | DRG: 641 | Discharge status: Against Medical Advice | Payer: Medicaid Other | Attending: Family Medicine | Admitting: Family Medicine

## 2017-11-29 ENCOUNTER — Emergency Department (HOSPITAL_COMMUNITY): Payer: Medicaid Other

## 2017-11-29 ENCOUNTER — Encounter (HOSPITAL_COMMUNITY): Payer: Self-pay

## 2017-11-29 ENCOUNTER — Other Ambulatory Visit: Payer: Self-pay

## 2017-11-29 DIAGNOSIS — F10239 Alcohol dependence with withdrawal, unspecified: Secondary | ICD-10-CM

## 2017-11-29 DIAGNOSIS — R55 Syncope and collapse: Secondary | ICD-10-CM | POA: Diagnosis not present

## 2017-11-29 DIAGNOSIS — F10939 Alcohol use, unspecified with withdrawal, unspecified: Secondary | ICD-10-CM

## 2017-11-29 DIAGNOSIS — K709 Alcoholic liver disease, unspecified: Secondary | ICD-10-CM

## 2017-11-29 DIAGNOSIS — R42 Dizziness and giddiness: Secondary | ICD-10-CM | POA: Diagnosis not present

## 2017-11-29 DIAGNOSIS — K7030 Alcoholic cirrhosis of liver without ascites: Secondary | ICD-10-CM | POA: Diagnosis present

## 2017-11-29 DIAGNOSIS — F17210 Nicotine dependence, cigarettes, uncomplicated: Secondary | ICD-10-CM | POA: Diagnosis present

## 2017-11-29 DIAGNOSIS — E785 Hyperlipidemia, unspecified: Secondary | ICD-10-CM | POA: Diagnosis present

## 2017-11-29 DIAGNOSIS — N179 Acute kidney failure, unspecified: Secondary | ICD-10-CM | POA: Diagnosis present

## 2017-11-29 DIAGNOSIS — E872 Acidosis: Secondary | ICD-10-CM | POA: Diagnosis present

## 2017-11-29 DIAGNOSIS — R251 Tremor, unspecified: Secondary | ICD-10-CM | POA: Diagnosis not present

## 2017-11-29 DIAGNOSIS — F1020 Alcohol dependence, uncomplicated: Secondary | ICD-10-CM | POA: Diagnosis present

## 2017-11-29 DIAGNOSIS — R945 Abnormal results of liver function studies: Secondary | ICD-10-CM | POA: Diagnosis not present

## 2017-11-29 DIAGNOSIS — S8011XA Contusion of right lower leg, initial encounter: Secondary | ICD-10-CM | POA: Diagnosis present

## 2017-11-29 DIAGNOSIS — E871 Hypo-osmolality and hyponatremia: Principal | ICD-10-CM | POA: Diagnosis present

## 2017-11-29 DIAGNOSIS — K921 Melena: Secondary | ICD-10-CM | POA: Diagnosis present

## 2017-11-29 DIAGNOSIS — D72829 Elevated white blood cell count, unspecified: Secondary | ICD-10-CM | POA: Diagnosis present

## 2017-11-29 DIAGNOSIS — Z7984 Long term (current) use of oral hypoglycemic drugs: Secondary | ICD-10-CM | POA: Diagnosis not present

## 2017-11-29 DIAGNOSIS — Z6827 Body mass index (BMI) 27.0-27.9, adult: Secondary | ICD-10-CM

## 2017-11-29 DIAGNOSIS — S80211A Abrasion, right knee, initial encounter: Secondary | ICD-10-CM

## 2017-11-29 DIAGNOSIS — Z79899 Other long term (current) drug therapy: Secondary | ICD-10-CM | POA: Diagnosis not present

## 2017-11-29 DIAGNOSIS — I959 Hypotension, unspecified: Secondary | ICD-10-CM

## 2017-11-29 DIAGNOSIS — E46 Unspecified protein-calorie malnutrition: Secondary | ICD-10-CM | POA: Diagnosis present

## 2017-11-29 DIAGNOSIS — W19XXXA Unspecified fall, initial encounter: Secondary | ICD-10-CM | POA: Diagnosis present

## 2017-11-29 DIAGNOSIS — S8012XA Contusion of left lower leg, initial encounter: Secondary | ICD-10-CM | POA: Diagnosis present

## 2017-11-29 DIAGNOSIS — Z9181 History of falling: Secondary | ICD-10-CM

## 2017-11-29 DIAGNOSIS — I4891 Unspecified atrial fibrillation: Secondary | ICD-10-CM | POA: Diagnosis present

## 2017-11-29 DIAGNOSIS — R7989 Other specified abnormal findings of blood chemistry: Secondary | ICD-10-CM

## 2017-11-29 LAB — BASIC METABOLIC PANEL
ANION GAP: 11 (ref 5–15)
ANION GAP: 12 (ref 5–15)
Anion gap: 11 (ref 5–15)
Anion gap: 11 (ref 5–15)
BUN: 9 mg/dL (ref 6–20)
BUN: 8 mg/dL (ref 6–20)
BUN: 11 mg/dL (ref 6–20)
BUN: 12 mg/dL (ref 6–20)
CALCIUM: 9.4 mg/dL (ref 8.9–10.3)
CALCIUM: 9.4 mg/dL (ref 8.9–10.3)
CALCIUM: 9.3 mg/dL (ref 8.9–10.3)
CHLORIDE: 86 mmol/L — AB (ref 98–111)
CHLORIDE: 83 mmol/L — AB (ref 98–111)
CO2: 23 mmol/L (ref 22–32)
CO2: 23 mmol/L (ref 22–32)
CO2: 21 mmol/L — ABNORMAL LOW (ref 22–32)
CO2: 22 mmol/L (ref 22–32)
CREATININE: 1.64 mg/dL — AB (ref 0.61–1.24)
Calcium: 9.5 mg/dL (ref 8.9–10.3)
Chloride: 88 mmol/L — ABNORMAL LOW (ref 98–111)
Chloride: 87 mmol/L — ABNORMAL LOW (ref 98–111)
Creatinine, Ser: 1.52 mg/dL — ABNORMAL HIGH (ref 0.61–1.24)
Creatinine, Ser: 1.58 mg/dL — ABNORMAL HIGH (ref 0.61–1.24)
Creatinine, Ser: 1.57 mg/dL — ABNORMAL HIGH (ref 0.61–1.24)
GFR calc Af Amer: >60 mL/min (ref >60)
GFR calc Af Amer: 58 mL/min — ABNORMAL LOW (ref >60)
GFR calc non Af Amer: 55 mL/min — ABNORMAL LOW (ref >60)
GFR calc non Af Amer: 52 mL/min — ABNORMAL LOW (ref >60)
GFR, EST NON AFRICAN AMERICAN: 53 mL/min — AB (ref >60)
GFR, EST NON AFRICAN AMERICAN: 50 mL/min — AB (ref >60)
GLUCOSE: 88 mg/dL (ref 70–99)
GLUCOSE: 103 mg/dL — AB (ref 70–99)
GLUCOSE: 84 mg/dL (ref 70–99)
Glucose, Bld: 85 mg/dL (ref 70–99)
POTASSIUM: 4.1 mmol/L (ref 3.5–5.1)
Potassium: 4 mmol/L (ref 3.5–5.1)
Potassium: 4.5 mmol/L (ref 3.5–5.1)
Potassium: 4.6 mmol/L (ref 3.5–5.1)
SODIUM: 120 mmol/L — AB (ref 135–145)
SODIUM: 120 mmol/L — AB (ref 135–145)
Sodium: 120 mmol/L — ABNORMAL LOW (ref 135–145)
Sodium: 118 mmol/L — CL (ref 135–145)

## 2017-11-29 LAB — TROPONIN I: Troponin I: 0.03 ng/mL (ref <0.03)

## 2017-11-29 LAB — MAGNESIUM: Magnesium: 1.2 mg/dL — ABNORMAL LOW (ref 1.7–2.4)

## 2017-11-29 LAB — COMPREHENSIVE METABOLIC PANEL
ALBUMIN: 2.5 g/dL — AB (ref 3.5–5.0)
ALK PHOS: 421 U/L — AB (ref 38–126)
ALT: 36 U/L (ref 0–44)
ANION GAP: 12 (ref 5–15)
AST: 121 U/L — AB (ref 15–41)
BUN: 10 mg/dL (ref 6–20)
CO2: 23 mmol/L (ref 22–32)
Calcium: 10 mg/dL (ref 8.9–10.3)
Chloride: 84 mmol/L — ABNORMAL LOW (ref 98–111)
Creatinine, Ser: 1.58 mg/dL — ABNORMAL HIGH (ref 0.61–1.24)
GFR calc Af Amer: >60 mL/min (ref >60)
GFR calc non Af Amer: 52 mL/min — ABNORMAL LOW (ref >60)
GLUCOSE: 101 mg/dL — AB (ref 70–99)
POTASSIUM: 3.6 mmol/L (ref 3.5–5.1)
SODIUM: 119 mmol/L — AB (ref 135–145)
Total Bilirubin: 2.5 mg/dL — ABNORMAL HIGH (ref 0.3–1.2)
Total Protein: 6.2 g/dL — ABNORMAL LOW (ref 6.5–8.1)

## 2017-11-29 LAB — CBC WITH DIFFERENTIAL/PLATELET
Abs Immature Granulocytes: 0.1 10*3/uL (ref 0.0–0.1)
BASOS ABS: 0.1 10*3/uL (ref 0.0–0.1)
Basophils Relative: 0 %
EOS PCT: 0 %
Eosinophils Absolute: 0.1 10*3/uL (ref 0.0–0.7)
HCT: 36.8 % — ABNORMAL LOW (ref 39.0–52.0)
HEMOGLOBIN: 13.3 g/dL (ref 13.0–17.0)
IMMATURE GRANULOCYTES: 1 %
LYMPHS ABS: 1.7 10*3/uL (ref 0.7–4.0)
LYMPHS PCT: 10 %
MCH: 35 pg — ABNORMAL HIGH (ref 26.0–34.0)
MCHC: 36.1 g/dL — AB (ref 30.0–36.0)
MCV: 96.8 fL (ref 78.0–100.0)
Monocytes Absolute: 1.1 10*3/uL — ABNORMAL HIGH (ref 0.1–1.0)
Monocytes Relative: 6 %
NEUTROS ABS: 15 10*3/uL — AB (ref 1.7–7.7)
NEUTROS PCT: 83 %
Platelets: 208 10*3/uL (ref 150–400)
RBC: 3.8 MIL/uL — AB (ref 4.22–5.81)
RDW: 15.2 % (ref 11.5–15.5)
WBC: 18.1 10*3/uL — ABNORMAL HIGH (ref 4.0–10.5)

## 2017-11-29 LAB — LIPASE, BLOOD: LIPASE: 42 U/L (ref 11–51)

## 2017-11-29 LAB — I-STAT CG4 LACTIC ACID, ED: Lactic Acid, Venous: 2.16 mmol/L (ref 0.5–1.9)

## 2017-11-29 LAB — ETHANOL: Alcohol, Ethyl (B): 89 mg/dL — ABNORMAL HIGH (ref <10)

## 2017-11-29 LAB — PROTIME-INR
INR: 1.87
PROTHROMBIN TIME: 21.3 s — AB (ref 11.4–15.2)

## 2017-11-29 LAB — LACTIC ACID, PLASMA
LACTIC ACID, VENOUS: 2.1 mmol/L — AB (ref 0.5–1.9)
Lactic Acid, Venous: 1.6 mmol/L (ref 0.5–1.9)

## 2017-11-29 LAB — MRSA PCR SCREENING: MRSA BY PCR: NEGATIVE

## 2017-11-29 MED ORDER — ONDANSETRON HCL 4 MG PO TABS: 4.0000 mg | ORAL_TABLET | Freq: Four times a day (QID) | ORAL | Status: DC | PRN

## 2017-11-29 MED ORDER — LORAZEPAM 2 MG/ML IJ SOLN: 0.0000 mg | Freq: Two times a day (BID) | INTRAMUSCULAR | Status: DC

## 2017-11-29 MED ORDER — LORAZEPAM 2 MG/ML IJ SOLN
0.0000 mg | Freq: Four times a day (QID) | INTRAMUSCULAR | Status: DC
  Administered 2017-11-29: 1 mg via INTRAVENOUS
  Filled 2017-11-29: qty 1

## 2017-11-29 MED ORDER — SODIUM CHLORIDE 0.9% FLUSH: 3.0000 mL | INTRAVENOUS | Status: DC | PRN

## 2017-11-29 MED ORDER — ONDANSETRON HCL 4 MG/2ML IJ SOLN
4.0000 mg | Freq: Once | INTRAMUSCULAR | Status: AC
  Administered 2017-11-29: 4 mg via INTRAVENOUS
  Filled 2017-11-29: qty 2

## 2017-11-29 MED ORDER — CALCIUM CARBONATE ANTACID 500 MG PO CHEW
1.0000 | CHEWABLE_TABLET | Freq: Once | ORAL | Status: AC
  Administered 2017-11-29: 200 mg via ORAL
  Filled 2017-11-29: qty 1
  Filled 2017-11-29: qty 2

## 2017-11-29 MED ORDER — SODIUM CHLORIDE 0.9% FLUSH: 3.0000 mL | Freq: Two times a day (BID) | INTRAVENOUS | Status: DC

## 2017-11-29 MED ORDER — THIAMINE HCL 100 MG/ML IJ SOLN: 100.0000 mg | Freq: Every day | INTRAMUSCULAR | Status: DC

## 2017-11-29 MED ORDER — MAGNESIUM SULFATE 2 GM/50ML IV SOLN
2.0000 g | Freq: Once | INTRAVENOUS | Status: AC
  Administered 2017-11-29: 2 g via INTRAVENOUS
  Filled 2017-11-29: qty 50

## 2017-11-29 MED ORDER — SODIUM CHLORIDE 0.9 % IV SOLN
INTRAVENOUS | Status: AC
  Administered 2017-11-29: 18:00:00 via INTRAVENOUS

## 2017-11-29 MED ORDER — LORAZEPAM 1 MG PO TABS: 0.0000 mg | ORAL_TABLET | Freq: Four times a day (QID) | ORAL | Status: DC

## 2017-11-29 MED ORDER — FOLIC ACID 5 MG/ML IJ SOLN
1.0000 mg | Freq: Every day | INTRAMUSCULAR | Status: DC
  Administered 2017-11-29: 1 mg via INTRAVENOUS
  Filled 2017-11-29 (×2): qty 0.2

## 2017-11-29 MED ORDER — SODIUM CHLORIDE 0.9 % IV SOLN: 250.0000 mL | INTRAVENOUS | Status: DC | PRN

## 2017-11-29 MED ORDER — SODIUM CHLORIDE 0.9% FLUSH
3.0000 mL | Freq: Two times a day (BID) | INTRAVENOUS | Status: DC
  Administered 2017-11-29 – 2017-11-30 (×2): 3 mL via INTRAVENOUS

## 2017-11-29 MED ORDER — ADULT MULTIVITAMIN W/MINERALS CH
1.0000 | ORAL_TABLET | Freq: Every day | ORAL | Status: DC
  Administered 2017-11-29 – 2017-11-30 (×2): 1 via ORAL
  Filled 2017-11-29 (×2): qty 1

## 2017-11-29 MED ORDER — LORAZEPAM 2 MG/ML IJ SOLN
2.0000 mg | INTRAMUSCULAR | Status: DC | PRN
  Administered 2017-11-29 – 2017-12-01 (×6): 2 mg via INTRAVENOUS
  Filled 2017-11-29 (×6): qty 1

## 2017-11-29 MED ORDER — ONDANSETRON HCL 4 MG/2ML IJ SOLN
4.0000 mg | Freq: Four times a day (QID) | INTRAMUSCULAR | Status: DC | PRN
  Administered 2017-11-29: 4 mg via INTRAVENOUS
  Filled 2017-11-29: qty 2

## 2017-11-29 MED ORDER — VITAMIN B-1 100 MG PO TABS
100.0000 mg | ORAL_TABLET | Freq: Every day | ORAL | Status: DC
  Administered 2017-11-30: 100 mg via ORAL
  Filled 2017-11-29: qty 1

## 2017-11-29 MED ORDER — ENOXAPARIN SODIUM 40 MG/0.4ML ~~LOC~~ SOLN
40.0000 mg | SUBCUTANEOUS | Status: DC
  Administered 2017-11-29: 40 mg via SUBCUTANEOUS
  Filled 2017-11-29 (×2): qty 0.4

## 2017-11-29 MED ORDER — LORAZEPAM 1 MG PO TABS: 0.0000 mg | ORAL_TABLET | Freq: Two times a day (BID) | ORAL | Status: DC

## 2017-11-29 MED ORDER — SODIUM CHLORIDE 0.9 % IV BOLUS
1000.0000 mL | Freq: Once | INTRAVENOUS | Status: AC
  Administered 2017-11-29: 1000 mL via INTRAVENOUS

## 2017-11-29 MED ORDER — CALCIUM CARBONATE ANTACID 500 MG PO CHEW
1500.0000 mg | CHEWABLE_TABLET | Freq: Every day | ORAL | Status: DC | PRN
  Administered 2017-12-01: 1500 mg via ORAL
  Filled 2017-11-29 (×2): qty 8

## 2017-11-29 MED ORDER — THIAMINE HCL 100 MG/ML IJ SOLN
100.0000 mg | Freq: Every day | INTRAMUSCULAR | Status: DC
  Administered 2017-11-29: 100 mg via INTRAVENOUS
  Filled 2017-11-29: qty 2

## 2017-11-29 NOTE — ED Provider Notes (Signed)
[facility] EMERGENCY DEPARTMENT Provider Note   CSN: [id number] Arrival date & time: 11/29/17  1030     History   Chief Complaint Chief Complaint  Patient presents with  . Near Syncope  . Weakness  . Fall    HPI Kristopher Barker is a 42 y.o. male.  Patient with alcohol liver disease, atrial fibrillation recently taken off Coumadin, followed at [facility] for liver disease, still drinking alcohol daily last drink yesterday evening presents with multiple different complaints.  Patient had worsening general weakness for about a week and has had multiple episodes of near syncope and has fallen and hit  his right knee, upper back.  Patient denies head injury.  Patient denies other cardiac history.  Patient denies active bleeding however stools have been darker than usual.  Patient has a history of varices and is seeing gastroenterology for these.  Patient denies fevers or chills.  No chest pain or shortness of breath.  Pain in the right knee with range of motion.     Past Medical History:  Diagnosis Date  . Alcoholism (HCC)   . Atrial fibrillation (HCC)   . Cirrhosis [facility])     Patient Active Problem List   Diagnosis Date Noted  . Alcohol withdrawal syndrome with complication (HCC)   . Syncope and collapse   . Hypotension   . LFT elevation   . Hypomagnesemia   . Knee abrasion, right, initial encounter   . Alcoholic liver disease (HCC) 08/21/2017  . Hyponatremia 08/21/2017  . Leukocytosis 08/21/2017  . Tachycardia 08/21/2017  . Abdominal pain 08/21/2017  . Non-intractable vomiting with nausea     Past Surgical History:  Procedure Laterality Date  . PERITONEOCENTESIS          Home Medications    Prior to Admission medications   Medication Sig Start Date End Date Taking? Authorizing Provider  calcium carbonate (TUMS EX) 750 MG chewable tablet Chew 2 tablets by mouth daily as needed for heartburn (stomach acid).   Yes [provider]    furosemide (LASIX) 40 MG tablet Take 40 mg by mouth 2 (two) times daily.  07/12/17 07/12/18 Yes [provider]  spironolactone (ALDACTONE) 100 MG tablet Take 100 mg by mouth daily.  07/12/17  Yes [provider]  warfarin (COUMADIN) 5 MG tablet Take 5 mg by mouth as directed. 11/15/17  Yes [provider]    Family History Family History  Problem Relation Age of Onset  . Atrial fibrillation Father     Social History Social History   Tobacco Use  . Smoking status: Current Every Day Smoker  . Smokeless tobacco: Current User  Substance Use Topics  . Alcohol use: Yes    Comment: 15 liquor drinks per day  . Drug use: Never     Allergies   Patient has no known allergies.   Review of Systems Review of Systems  Constitutional: Positive for appetite change and fever. Negative for chills.  HENT: Negative for congestion.   Eyes: Negative for visual disturbance.  Respiratory: Negative for shortness of breath.   Cardiovascular: Negative for chest pain.  Gastrointestinal: Positive for nausea and vomiting. Negative for abdominal pain.  Genitourinary: Negative for dysuria and flank pain.  Musculoskeletal: Positive for neck pain. Negative for back pain and neck stiffness.  Skin: Negative for rash.  Neurological: Positive for syncope and light-headedness. Negative for headaches.     Physical Exam Updated Vital Signs BP (!) 87/57 (BP Location: Left Arm)  Pulse 93   Temp 99.5 F (37.5 C) (Oral)   Resp 19   Ht 6\' 1"  (1.854 m)   Wt 93 kg (205 lb)   SpO2 96%   BMI 27.05 kg/m   Physical Exam  Constitutional: He is oriented to person, place, and time. He appears well-developed. No distress.  HENT:  Head: Normocephalic and atraumatic.  Dry mm, mild midline lower cervical tenderness  Eyes: Conjunctivae are normal. Right eye exhibits no discharge. Left eye exhibits no discharge. No scleral icterus.  Neck: Neck supple. No tracheal deviation present.   Cardiovascular: Regular rhythm. Tachycardia present.  Pulmonary/Chest: Effort normal and breath sounds normal.  Abdominal: Soft. He exhibits no distension. There is no tenderness. There is no guarding.  Musculoskeletal: He exhibits edema and tenderness.  Patient has tendernessAnd superficial abrasion to the right lateral knee without joint effusion.  Patient can flex and extend knees with mild discomfort in the right knee.  No hip tenderness.  No midline lower thoracic or lumbar tenderness.  Neurological: He is alert and oriented to person, place, and time. No cranial nerve deficit. Coordination normal. GCS eye subscore is 4. GCS verbal subscore is 5. GCS motor subscore is 6.  Patient has general weakness on exam, 5+ strength in isolation of flexing bilateral arms and legs.  Patient does have decreased grip strength in the left hand.  Patient has mild decreased strength with flexion extension of the left wrist and abduction left shoulder.  Patient sensation intact bilateral upper and lower extremities bilateral.  Skin: Skin is warm. No rash noted.  Psychiatric: He has a normal mood and affect.  Nursing note and vitals reviewed.    ED Treatments / Results  Labs (all labs ordered are listed, but only abnormal results are displayed) Labs Reviewed  CBC WITH DIFFERENTIAL/PLATELET - Abnormal; Notable for the following components:      Result Value   WBC 18.1 (*)    RBC 3.80 (*)    HCT 36.8 (*)    MCH 35.0 (*)    MCHC 36.1 (*)    Neutro Abs 15.0 (*)    Monocytes Absolute 1.1 (*)    All other components within normal limits  COMPREHENSIVE METABOLIC PANEL - Abnormal; Notable for the following components:   Sodium 119 (*)    Chloride 84 (*)    Glucose, Bld 101 (*)    Creatinine, Ser 1.58 (*)    Total Protein 6.2 (*)    Albumin 2.5 (*)    AST 121 (*)    Alkaline Phosphatase 421 (*)    Total Bilirubin 2.5 (*)    GFR calc non Af Amer 52 (*)    All other components within normal limits   MAGNESIUM - Abnormal; Notable for the following components:   Magnesium 1.2 (*)    All other components within normal limits  ETHANOL - Abnormal; Notable for the following components:   Alcohol, Ethyl (B) 89 (*)    All other components within normal limits  PROTIME-INR - Abnormal; Notable for the following components:   Prothrombin Time 21.3 (*)    All other components within normal limits  TROPONIN I - Abnormal; Notable for the following components:   Troponin I 0.03 (*)    All other components within normal limits  BASIC METABOLIC PANEL - Abnormal; Notable for the following components:   Sodium 120 (*)    Chloride 86 (*)    Creatinine, Ser 1.52 (*)    GFR calc non Af [place] Dago[place]mer  55 (*)    All other components within normal limits  BASIC METABOLIC PANEL - Abnormal; Notable for the following components:   Sodium 118 (*)    Chloride 83 (*)    Glucose, Bld 103 (*)    Creatinine, Ser 1.58 (*)    GFR calc non Af Amer 52 (*)    All other components within normal limits  LACTIC ACID, PLASMA - Abnormal; Notable for the following components:   Lactic Acid, Venous 2.1 (*)    All other components within normal limits  COMPREHENSIVE METABOLIC PANEL - Abnormal; Notable for the following components:   Sodium 120 (*)    Chloride 88 (*)    Creatinine, Ser 1.71 (*)    Total Protein 5.6 (*)    Albumin 2.2 (*)    AST 115 (*)    Alkaline Phosphatase 379 (*)    Total Bilirubin 3.2 (*)    GFR calc non Af Amer 48 (*)    GFR calc Af Amer 55 (*)    All other components within normal limits  CBC - Abnormal; Notable for the following components:   WBC 16.9 (*)    RBC 3.36 (*)    Hemoglobin 11.8 (*)    HCT 32.8 (*)    MCH 35.1 (*)    Platelets 144 (*)    All other components within normal limits  BASIC METABOLIC PANEL - Abnormal; Notable for the following components:   Sodium 120 (*)    Chloride 88 (*)    CO2 21 (*)    Creatinine, Ser 1.57 (*)    GFR calc non Af Amer 53 (*)    All other  components within normal limits  BASIC METABOLIC PANEL - Abnormal; Notable for the following components:   Sodium 120 (*)    Chloride 88 (*)    Creatinine, Ser 1.75 (*)    GFR calc non Af Amer 46 (*)    GFR calc Af Amer 54 (*)    All other components within normal limits  BASIC METABOLIC PANEL - Abnormal; Notable for the following components:   Sodium 120 (*)    Chloride 87 (*)    Creatinine, Ser 1.64 (*)    GFR calc non Af Amer 50 (*)    GFR calc Af Amer 58 (*)    All other components within normal limits  I-STAT CG4 LACTIC ACID, ED - Abnormal; Notable for the following components:   Lactic Acid, Venous 2.16 (*)    All other components within normal limits  MRSA PCR SCREENING  LIPASE, BLOOD  LACTIC ACID, PLASMA  TROPONIN I  TROPONIN I  MAGNESIUM  TROPONIN I  BASIC METABOLIC PANEL  BASIC METABOLIC PANEL  BASIC METABOLIC PANEL    EKG EKG Interpretation  Date/Time:  Sunday November 29 2017 10:34:23 EDT Ventricular Rate:  98 PR Interval:    QRS Duration: 106 QT Interval:  355 QTC Calculation: 454 R Axis:   67 Text Interpretation:  Sinus rhythm Confirmed by Blane Ohara [phone]) on 11/29/2017 10:57:06 AM Also confirmed by Blane Ohara [phone]), editor Barbette Hair [phone])  on 11/29/2017 12:32:28 PM   Radiology Dg Thoracic Spine 2 View  Result Date: 11/29/2017 CLINICAL DATA:  Fall onto grass.  Patient could not get up. EXAM: THORACIC SPINE 2 VIEWS COMPARISON:  None. FINDINGS: There is no evidence of thoracic spine fracture. Alignment is normal. Levels of disc narrowing and endplate spurring. IMPRESSION: Negative. Electronically Signed   By: Kathrynn Ducking.D.  On: 11/29/2017 13:01   Ct Head Wo Contrast  Result Date: 11/29/2017 CLINICAL DATA:  42 year old male with acute headache and neck pain following multiple recent falls. EXAM: CT HEAD WITHOUT CONTRAST CT CERVICAL SPINE WITHOUT CONTRAST TECHNIQUE: Multidetector CT imaging of the head and cervical spine was performed  following the standard protocol without intravenous contrast. Multiplanar CT image reconstructions of the cervical spine were also generated. COMPARISON:  None. FINDINGS: CT HEAD FINDINGS Brain: No evidence of acute infarction, hemorrhage, hydrocephalus, extra-axial collection or mass lesion/mass effect. Mild volume loss noted. Vascular: Mild carotid atherosclerotic calcifications noted. Skull: Normal. Negative for fracture or focal lesion. Sinuses/Orbits: No acute finding. Other: None. CT CERVICAL SPINE FINDINGS Alignment: Normal. Skull base and vertebrae: No acute fracture. No primary bone lesion or focal pathologic process. Soft tissues and spinal canal: No prevertebral fluid or swelling. No visible canal hematoma. Disc levels: RIGHT facet arthropathy at C3-4 causes mild bony foraminal narrowing. Upper chest: Negative. Other: None IMPRESSION: 1. No evidence of acute intracranial abnormality 2. No static evidence of acute injury to the cervical spine. Electronically Signed   By: Harmon Pier M.D.   On: 11/29/2017 12:52   Ct Cervical Spine Wo Contrast  Result Date: 11/29/2017 CLINICAL DATA:  42 year old male with acute headache and neck pain following multiple recent falls. EXAM: CT HEAD WITHOUT CONTRAST CT CERVICAL SPINE WITHOUT CONTRAST TECHNIQUE: Multidetector CT imaging of the head and cervical spine was performed following the standard protocol without intravenous contrast. Multiplanar CT image reconstructions of the cervical spine were also generated. COMPARISON:  None. FINDINGS: CT HEAD FINDINGS Brain: No evidence of acute infarction, hemorrhage, hydrocephalus, extra-axial collection or mass lesion/mass effect. Mild volume loss noted. Vascular: Mild carotid atherosclerotic calcifications noted. Skull: Normal. Negative for fracture or focal lesion. Sinuses/Orbits: No acute finding. Other: None. CT CERVICAL SPINE FINDINGS Alignment: Normal. Skull base and vertebrae: No acute fracture. No primary bone lesion  or focal pathologic process. Soft tissues and spinal canal: No prevertebral fluid or swelling. No visible canal hematoma. Disc levels: RIGHT facet arthropathy at C3-4 causes mild bony foraminal narrowing. Upper chest: Negative. Other: None IMPRESSION: 1. No evidence of acute intracranial abnormality 2. No static evidence of acute injury to the cervical spine. Electronically Signed   By: Harmon Pier M.D.   On: 11/29/2017 12:52   Dg Knee Complete 4 Views Right  Result Date: 11/29/2017 CLINICAL DATA:  Fall landing on grass. EXAM: RIGHT KNEE - COMPLETE 4+ VIEW COMPARISON:  None. FINDINGS: No evidence of fracture, dislocation, or joint effusion. No evidence of arthropathy or other focal bone abnormality. Soft tissues are unremarkable. IMPRESSION: Negative. Electronically Signed   By: Marnee Spring M.D.   On: 11/29/2017 12:59    Procedures Procedures (including critical care time)  Medications Ordered in ED Medications  thiamine (VITAMIN B-1) tablet 100 mg ( Oral See Alternative 11/29/17 1344)    Or  thiamine (B-1) injection 100 mg (100 mg Intravenous Given 11/29/17 1344)  calcium carbonate (TUMS - dosed in mg elemental calcium) chewable tablet 1,500 mg (has no administration in time range)  enoxaparin (LOVENOX) injection 40 mg (40 mg Subcutaneous Given 11/29/17 1638)  sodium chloride flush (NS) 0.9 % injection 3 mL (3 mLs Intravenous Given 11/29/17 2228)  ondansetron (ZOFRAN) tablet 4 mg ( Oral See Alternative 11/29/17 2230)    Or  ondansetron (ZOFRAN) injection 4 mg (4 mg Intravenous Given 11/29/17 2230)  LORazepam (ATIVAN) injection 2-3 mg (2 mg Intravenous Given 11/30/17 0558)  folic acid injection 1  mg (1 mg Intravenous Given 11/29/17 1751)  multivitamin with minerals tablet 1 tablet (1 tablet Oral Given 11/29/17 1638)  0.9 %  sodium chloride infusion ( Intravenous New Bag/Given 11/29/17 1754)  0.9 %  sodium chloride infusion (has no administration in time range)  sodium chloride 0.9 % bolus 1,000  mL (0 mLs Intravenous Stopped 11/29/17 1412)  magnesium sulfate IVPB 2 g 50 mL (0 g Intravenous Stopped 11/29/17 1449)  calcium carbonate (TUMS - dosed in mg elemental calcium) chewable tablet 200-400 mg of elemental calcium (200 mg of elemental calcium Oral Given 11/29/17 1638)  ondansetron (ZOFRAN) injection 4 mg (4 mg Intravenous Given 11/29/17 1638)     Initial Impression / Assessment and Plan / ED Course  I have reviewed the triage vital signs and the nursing notes.  Pertinent labs & imaging results that were available during my care of the patient were reviewed by me and considered in my medical decision making (see chart for details).    Patient with alcoholic liver disease presents with recurrent syncope and general weakness likely combination from alcohol abuse, dehydration.  With injuries plan for CT scan of the head neck and right knee.  With alcoholic cirrhosis history plan to check blood working including liver function, electrolyte, anemia and other reasons why he would be weak and falling.  Patient does have mild weakness with grip strength in the left however he said he had that a week ago prior to his recent falls.  Labs revealed significant hyponatremia, hypomagnesemia.  Plan to give fluid bolus, magnesium bolus.  Patient having mild withdrawal symptoms ciwa ordered.  Discussed with internal medicine for admission.  CT scan results no acute findings.  The patients results and plan were reviewed and discussed.   Any x-rays performed were independently reviewed by myself.   Differential diagnosis were considered with the presenting HPI.  Medications  thiamine (VITAMIN B-1) tablet 100 mg ( Oral See Alternative 11/29/17 1344)    Or  thiamine (B-1) injection 100 mg (100 mg Intravenous Given 11/29/17 1344)  calcium carbonate (TUMS - dosed in mg elemental calcium) chewable tablet 1,500 mg (has no administration in time range)  enoxaparin (LOVENOX) injection 40 mg (40 mg Subcutaneous  Given 11/29/17 1638)  sodium chloride flush (NS) 0.9 % injection 3 mL (3 mLs Intravenous Given 11/29/17 2228)  ondansetron (ZOFRAN) tablet 4 mg ( Oral See Alternative 11/29/17 2230)    Or  ondansetron (ZOFRAN) injection 4 mg (4 mg Intravenous Given 11/29/17 2230)  LORazepam (ATIVAN) injection 2-3 mg (2 mg Intravenous Given 11/30/17 0558)  folic acid injection 1 mg (1 mg Intravenous Given 11/29/17 1751)  multivitamin with minerals tablet 1 tablet (1 tablet Oral Given 11/29/17 1638)  0.9 %  sodium chloride infusion ( Intravenous New Bag/Given 11/29/17 1754)  0.9 %  sodium chloride infusion (has no administration in time range)  sodium chloride 0.9 % bolus 1,000 mL (0 mLs Intravenous Stopped 11/29/17 1412)  magnesium sulfate IVPB 2 g 50 mL (0 g Intravenous Stopped 11/29/17 1449)  calcium carbonate (TUMS - dosed in mg elemental calcium) chewable tablet 200-400 mg of elemental calcium (200 mg of elemental calcium Oral Given 11/29/17 1638)  ondansetron (ZOFRAN) injection 4 mg (4 mg Intravenous Given 11/29/17 1638)    Final diagnoses:  Alcoholic liver disease (HCC)  Syncope and collapse  Hypotension, unspecified hypotension type  Hyponatremia  LFT elevation  Hypomagnesemia  Knee abrasion, right, initial encounter    Admission/ observation were discussed with the admitting physician,  patient and/or family and they are comfortable with the plan.   Final Clinical Impressions(s) / ED Diagnoses   Final diagnoses:  Alcoholic liver disease (HCC)  Syncope and collapse  Hypotension, unspecified hypotension type  Hyponatremia  LFT elevation  Hypomagnesemia  Knee abrasion, right, initial encounter    ED Discharge Orders    None       Blane Ohara, MD 11/30/17 [phone]

## 2017-11-29 NOTE — Progress Notes (Signed)
Spoke with Mr. Kristopher Barker at bedside at 8:30pm.  Pt was alert and engaged in the interview at that time.  He did mention that he was experiencing tremors and nausea.  These changes were not acute and were factored into his last CIWA score of: 14 (6:00 pm). Pt received ativan 2 mg at 1828.    Pt does not appear to have any confusion at this time in the setting of a CIWA of 14 and hyponatremia to 118 (6:00 pm). Will monitor throughout the night.

## 2017-11-29 NOTE — ED Notes (Signed)
Attempted Report 

## 2017-11-29 NOTE — Progress Notes (Addendum)
Family Medicine Teaching Service Daily Progress Note Intern Pager: [phone]  Patient name: Kristopher Barker Medical record number: [id number] Date of birth: 1975/06/01 Age: 42 y.o. Gender: male  Primary Care Provider: Patient, No Pcp Per Consultants: Neuro, PT/OT, CSW Code Status: Full code  Pt Overview and Major Events to Date:  7/28 Admitted for dizziness  Assessment and Plan: Kristopher Barker is a 42 yo male who presented with dizziness.  PMH significant for alcoholic liver disease, A Fib recently taken off coumadin for multiple falls.   Alcoholic Liver Disease: Chronic  Overnight patient states that he had some episodes of tremors.  No seizures. CIWA 14>16>0>0>5>9.  Has received a total of 31m Ativan since admission.  Currently sleepy and states that he feels somewhat shaky.   - cont CIWA protocal - if CIWA consistently elevated, consult CCM for precedex - f/u Social work - f/u PT/OT - cont Seizure precautions - can add librium if needed for withdrawal  Hyponatremia:2/2 alcohol use, Improving  Baseline Na 123-126.  Na 119>120>118>120 x multiple readings>119. S/P 1500cc yesterday. Had noted no improvement on 50cc/hr and 75cc/hr last PM. - cont IVF NS 100cc/hr x 10hrs - Cont to monitor BMP Q4hrs - serum osmolality, urine osmolality, urine Na  Leukocytosis: Chronic WBC this AM 16.9. Afebrile overnight. Lactic Acid 2.16>1.6>2.1. This AM abdomen non-tender to palpation, no nausea or vomiting. - cont to monitor CBC - cont Tum's  - consider blood cultures if becomes febrile  AKI: Worsening Baseline 0.6-0.7. Cr this AM 1.75>1.71>1.80. - f/u BMP - cont IVF per Hyponatremia  Chest pain: Stable Troponins 0.03>Negative>Negative. This AM patient denies any chest pain or SOB. - cont to monitor vitals - cont to monitor for symptoms   Protein calore malnutrition: Chronic, likely 2/2 alcoholism Albumin 2.5 on admission. - f/u nutrition consult - cont to encourage PO  intake - cont to encourage cessation of alcohol use  Melena: Chronic, stable BM x2 yesterday.  Continues to have very dark stools. Hgb this AM 11.8, down from 13.3 on admission. Has seen GI and had recent colonoscopy. - cont to monitor CBC in AM - cont to monitor - consider GI consult as needed  Tobacco abuse: Chronic Smokes 1ppd - encourage cessation - Nicotine patch daily  Atrial Fibrillation: Not on anti-coagulation Regular rhythm on exam. - cont cardiac monitoring - consider cards consult as needed    FEN/GI: Regular Diet PPx: Lovenox  Disposition: pending clinical improvement  Subjective:  Patient complaining of some feelings of shakiness this AM and overnight.  States that he was no able to get much sleep last PM.  Notes that he is very tired this morning.  Objective: Temp:  [98.4 F (36.9 C)-99.8 F (37.7 C)] 98.7 F (37.1 C) (07/29 0750) Pulse Rate:  [90-112] 90 (07/29 0750) Resp:  [12-28] 16 (07/29 0750) BP: (81-103)/(52-77) 96/66 (07/29 0750) SpO2:  [95 %-100 %] 97 % (07/29 0750) Weight:  [205 lb (93 kg)] 205 lb (93 kg) (07/28 1036)  Physical Exam: General: 42y.o. y.o. male in NAD Cardio: RRR no m/r/g Lungs: CTAB, no wheezing, no rhonchi, no crackles Abdomen: Soft, non-tender to palpation, positive bowel sounds Skin: warm and dry Extremities: No edema Neuro: Grossly neurologically intact  Laboratory: Recent Labs  Lab 11/29/17 1050 11/30/17 0342  WBC 18.1* 16.9*  HGB 13.3 11.8*  HCT 36.8* 32.8*  PLT 208 144*   Recent Labs  Lab 11/29/17 1050  11/30/17 0034 11/30/17 0342 11/30/17 0749  NA 119*   < >  120* 120* 119*  K 3.6   < > 4.6 4.3 5.2*  CL 84*   < > 88* 88* 88*  CO2 23   < > 23 25 21*  BUN 10   < > '11 11 12  ' CREATININE 1.58*   < > 1.75* 1.71* 1.80*  CALCIUM 10.0   < > 9.3 9.2 9.0  PROT 6.2*  --   --  5.6*  --   BILITOT 2.5*  --   --  3.2*  --   ALKPHOS 421*  --   --  379*  --   ALT 36  --   --  34  --   AST 121*  --   --  115*   --   GLUCOSE 101*   < > 89 86 84   < > = values in this interval not displayed.   Troponins 0.03/<0.03/<0.03  Lipase 42  AST 121/ALT36/Alk Phos 421/Total Bili 2.5  Imaging/Diagnostic Tests: Dg Thoracic Spine 2 View  Result Date: 11/29/2017 CLINICAL DATA:  Fall onto grass.  Patient could not get up. EXAM: THORACIC SPINE 2 VIEWS COMPARISON:  None. FINDINGS: There is no evidence of thoracic spine fracture. Alignment is normal. Levels of disc narrowing and endplate spurring. IMPRESSION: Negative. Electronically Signed   By: Monte Fantasia M.D.   On: 11/29/2017 13:01   Ct Head Wo Contrast  Result Date: 11/29/2017 CLINICAL DATA:  42 year old male with acute headache and neck pain following multiple recent falls. EXAM: CT HEAD WITHOUT CONTRAST CT CERVICAL SPINE WITHOUT CONTRAST TECHNIQUE: Multidetector CT imaging of the head and cervical spine was performed following the standard protocol without intravenous contrast. Multiplanar CT image reconstructions of the cervical spine were also generated. COMPARISON:  None. FINDINGS: CT HEAD FINDINGS Brain: No evidence of acute infarction, hemorrhage, hydrocephalus, extra-axial collection or mass lesion/mass effect. Mild volume loss noted. Vascular: Mild carotid atherosclerotic calcifications noted. Skull: Normal. Negative for fracture or focal lesion. Sinuses/Orbits: No acute finding. Other: None. CT CERVICAL SPINE FINDINGS Alignment: Normal. Skull base and vertebrae: No acute fracture. No primary bone lesion or focal pathologic process. Soft tissues and spinal canal: No prevertebral fluid or swelling. No visible canal hematoma. Disc levels: RIGHT facet arthropathy at C3-4 causes mild bony foraminal narrowing. Upper chest: Negative. Other: None IMPRESSION: 1. No evidence of acute intracranial abnormality 2. No static evidence of acute injury to the cervical spine. Electronically Signed   By: Margarette Canada M.D.   On: 11/29/2017 12:52   Ct Cervical Spine Wo  Contrast  Result Date: 11/29/2017 CLINICAL DATA:  42 year old male with acute headache and neck pain following multiple recent falls. EXAM: CT HEAD WITHOUT CONTRAST CT CERVICAL SPINE WITHOUT CONTRAST TECHNIQUE: Multidetector CT imaging of the head and cervical spine was performed following the standard protocol without intravenous contrast. Multiplanar CT image reconstructions of the cervical spine were also generated. COMPARISON:  None. FINDINGS: CT HEAD FINDINGS Brain: No evidence of acute infarction, hemorrhage, hydrocephalus, extra-axial collection or mass lesion/mass effect. Mild volume loss noted. Vascular: Mild carotid atherosclerotic calcifications noted. Skull: Normal. Negative for fracture or focal lesion. Sinuses/Orbits: No acute finding. Other: None. CT CERVICAL SPINE FINDINGS Alignment: Normal. Skull base and vertebrae: No acute fracture. No primary bone lesion or focal pathologic process. Soft tissues and spinal canal: No prevertebral fluid or swelling. No visible canal hematoma. Disc levels: RIGHT facet arthropathy at C3-4 causes mild bony foraminal narrowing. Upper chest: Negative. Other: None IMPRESSION: 1. No evidence of acute intracranial abnormality 2. No  static evidence of acute injury to the cervical spine. Electronically Signed   By: Margarette Canada M.D.   On: 11/29/2017 12:52   Dg Knee Complete 4 Views Right  Result Date: 11/29/2017 CLINICAL DATA:  Fall landing on grass. EXAM: RIGHT KNEE - COMPLETE 4+ VIEW COMPARISON:  None. FINDINGS: No evidence of fracture, dislocation, or joint effusion. No evidence of arthropathy or other focal bone abnormality. Soft tissues are unremarkable. IMPRESSION: Negative. Electronically Signed   By: Monte Fantasia M.D.   On: 11/29/2017 12:59     Glenwood, Bernita Raisin, DO 11/30/2017, 9:48 AM PGY-1, [place] Intern pager: [phone], text pages welcome

## 2017-11-29 NOTE — ED Triage Notes (Signed)
Patient fell and landed in grass and could not get up. [facility]. Patient D/C on blood thinners 6 days ago.
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
## 2017-11-29 NOTE — ED Notes (Signed)
Patient transported to CT 

## 2017-11-30 DIAGNOSIS — F10239 Alcohol dependence with withdrawal, unspecified: Secondary | ICD-10-CM

## 2017-11-30 DIAGNOSIS — F10939 Alcohol use, unspecified with withdrawal, unspecified: Secondary | ICD-10-CM

## 2017-11-30 LAB — BASIC METABOLIC PANEL
ANION GAP: 7 (ref 5–15)
ANION GAP: 9 (ref 5–15)
Anion gap: 10 (ref 5–15)
Anion gap: 11 (ref 5–15)
BUN: 11 mg/dL (ref 6–20)
BUN: 11 mg/dL (ref 6–20)
BUN: 11 mg/dL (ref 6–20)
BUN: 12 mg/dL (ref 6–20)
CHLORIDE: 90 mmol/L — AB (ref 98–111)
CO2: 23 mmol/L (ref 22–32)
CO2: 21 mmol/L — ABNORMAL LOW (ref 22–32)
CO2: 22 mmol/L (ref 22–32)
CO2: 25 mmol/L (ref 22–32)
CREATININE: 1.78 mg/dL — AB (ref 0.61–1.24)
Calcium: 9.3 mg/dL (ref 8.9–10.3)
Calcium: 9 mg/dL (ref 8.9–10.3)
Calcium: 9.1 mg/dL (ref 8.9–10.3)
Calcium: 8.7 mg/dL — ABNORMAL LOW (ref 8.9–10.3)
Chloride: 88 mmol/L — ABNORMAL LOW (ref 98–111)
Chloride: 88 mmol/L — ABNORMAL LOW (ref 98–111)
Chloride: 88 mmol/L — ABNORMAL LOW (ref 98–111)
Creatinine, Ser: 1.75 mg/dL — ABNORMAL HIGH (ref 0.61–1.24)
Creatinine, Ser: 1.8 mg/dL — ABNORMAL HIGH (ref 0.61–1.24)
Creatinine, Ser: 1.79 mg/dL — ABNORMAL HIGH (ref 0.61–1.24)
GFR calc Af Amer: 52 mL/min — ABNORMAL LOW (ref >60)
GFR calc Af Amer: 53 mL/min — ABNORMAL LOW (ref >60)
GFR calc non Af Amer: 45 mL/min — ABNORMAL LOW (ref >60)
GFR, EST AFRICAN AMERICAN: 52 mL/min — AB (ref >60)
GFR, EST AFRICAN AMERICAN: 54 mL/min — AB (ref >60)
GFR, EST NON AFRICAN AMERICAN: 45 mL/min — AB (ref >60)
GFR, EST NON AFRICAN AMERICAN: 45 mL/min — AB (ref >60)
GFR, EST NON AFRICAN AMERICAN: 46 mL/min — AB (ref >60)
Glucose, Bld: 89 mg/dL (ref 70–99)
Glucose, Bld: 84 mg/dL (ref 70–99)
Glucose, Bld: 83 mg/dL (ref 70–99)
Glucose, Bld: 87 mg/dL (ref 70–99)
POTASSIUM: 4 mmol/L (ref 3.5–5.1)
POTASSIUM: 4.6 mmol/L (ref 3.5–5.1)
Potassium: 5.2 mmol/L — ABNORMAL HIGH (ref 3.5–5.1)
Potassium: 4.3 mmol/L (ref 3.5–5.1)
SODIUM: 119 mmol/L — AB (ref 135–145)
SODIUM: 120 mmol/L — AB (ref 135–145)
SODIUM: 121 mmol/L — AB (ref 135–145)
SODIUM: 122 mmol/L — AB (ref 135–145)

## 2017-11-30 LAB — COMPREHENSIVE METABOLIC PANEL
ALK PHOS: 379 U/L — AB (ref 38–126)
ALT: 34 U/L (ref 0–44)
AST: 115 U/L — AB (ref 15–41)
Albumin: 2.2 g/dL — ABNORMAL LOW (ref 3.5–5.0)
Anion gap: 7 (ref 5–15)
BUN: 11 mg/dL (ref 6–20)
CHLORIDE: 88 mmol/L — AB (ref 98–111)
CO2: 25 mmol/L (ref 22–32)
CREATININE: 1.71 mg/dL — AB (ref 0.61–1.24)
Calcium: 9.2 mg/dL (ref 8.9–10.3)
GFR calc non Af Amer: 48 mL/min — ABNORMAL LOW (ref >60)
GFR, EST AFRICAN AMERICAN: 55 mL/min — AB (ref >60)
Glucose, Bld: 86 mg/dL (ref 70–99)
Potassium: 4.3 mmol/L (ref 3.5–5.1)
SODIUM: 120 mmol/L — AB (ref 135–145)
Total Bilirubin: 3.2 mg/dL — ABNORMAL HIGH (ref 0.3–1.2)
Total Protein: 5.6 g/dL — ABNORMAL LOW (ref 6.5–8.1)

## 2017-11-30 LAB — SODIUM, URINE, RANDOM: Sodium, Ur: <10 mmol/L

## 2017-11-30 LAB — CBC
HCT: 32.8 % — ABNORMAL LOW (ref 39.0–52.0)
Hemoglobin: 11.8 g/dL — ABNORMAL LOW (ref 13.0–17.0)
MCH: 35.1 pg — ABNORMAL HIGH (ref 26.0–34.0)
MCHC: 36 g/dL (ref 30.0–36.0)
MCV: 97.6 fL (ref 78.0–100.0)
Platelets: 144 10*3/uL — ABNORMAL LOW (ref 150–400)
RBC: 3.36 MIL/uL — AB (ref 4.22–5.81)
RDW: 15.2 % (ref 11.5–15.5)
WBC: 16.9 10*3/uL — AB (ref 4.0–10.5)

## 2017-11-30 LAB — OSMOLALITY, URINE: Osmolality, Ur: 257 mOsm/kg — ABNORMAL LOW (ref 300–900)

## 2017-11-30 LAB — OSMOLALITY: Osmolality: 254 mOsm/kg — ABNORMAL LOW (ref 275–295)

## 2017-11-30 LAB — TROPONIN I: Troponin I: <0.03 ng/mL (ref <0.03)

## 2017-11-30 LAB — MAGNESIUM: MAGNESIUM: 1.8 mg/dL (ref 1.7–2.4)

## 2017-11-30 MED ORDER — BOOST / RESOURCE BREEZE PO LIQD CUSTOM
1.0000 | Freq: Three times a day (TID) | ORAL | Status: DC
  Administered 2017-11-30 (×2): 1 via ORAL

## 2017-11-30 MED ORDER — FOLIC ACID 1 MG PO TABS
1.0000 mg | ORAL_TABLET | Freq: Every day | ORAL | Status: DC
  Administered 2017-11-30: 1 mg via ORAL
  Filled 2017-11-30: qty 1

## 2017-11-30 MED ORDER — SODIUM CHLORIDE 0.9 % IV SOLN
INTRAVENOUS | Status: AC
  Administered 2017-11-30: 10:00:00 via INTRAVENOUS

## 2017-11-30 NOTE — Evaluation (Signed)
Physical Therapy Evaluation Patient Details Name: Kristopher Barker MRN: [id number] DOB: [birth year] Today's Date: 11/30/2017   History of Present Illness  Pt is a 42 year old man with hx of alcohol abuse, liver disease, afib and multiple falls admitted 11/29/17 with dizziness and hyponatremia.   Clinical Impression  PTA pt independent in functional mobility and ADLs, however pt on disability for chronic alcoholism and has lost his license secondary to UTI. Pt living with friends in basement bedroom and uses bathroom on first floor. Pt is working towards admission in alcohol rehab program. Pt is min guard for bed mobility for safety with increased time to come to Eo[facility]. Pt min guard for sit>stand for safety, pt able to steady himself without assist. Pt ambulated 240 feet in hallway with min guard assist, with head turns pt drifts right and left. At end of ambulation pt had 1x LoB requiring minA for steadying due to scissoring of his feet. PT will continue to work with pt acutely on higher level balance and stair training, but anticipate that he will be able to d/c to his friend's house without additional PT needs.     Follow Up Recommendations No PT follow up    Equipment Recommendations  None recommended by PT       Precautions / Restrictions Precautions Precautions: Fall Restrictions Weight Bearing Restrictions: No      Mobility  Bed Mobility Overal bed mobility: Needs Assistance Bed Mobility: Supine to Sit     Supine to sit: Min guard     General bed mobility comments: min guard for safety, increased time   Transfers Overall transfer level: Needs assistance Equipment used: None Transfers: Sit to/from Stand Sit to Stand: Min guard         General transfer comment: min guard for safety  Ambulation/Gait Ambulation/Gait assistance: Min assist;Min guard Gait Distance (Feet): 240 Feet Assistive device: None Gait Pattern/deviations: Step-through pattern;Scissoring;Narrow  base of support;Decreased stride length Gait velocity: slowed Gait velocity interpretation: 1.31 - 2.62 ft/sec, indicative of limited community ambulator General Gait Details: min guard for safety, with minA for 1xLoB, with scissoring of feet, pt with decreased stride length and narrow BoS putting him at increased risk for falling       Balance Overall balance assessment: Needs assistance Sitting-balance support: Feet supported;No upper extremity supported Sitting balance-Leahy Scale: Good     Standing balance support: During functional activity;No upper extremity supported Standing balance-Leahy Scale: Fair               High level balance activites: Head turns High Level Balance Comments: min guard for ambulation with head turns, decrease in velocity and drifting             Pertinent Vitals/Pain Pain Assessment: No/denies pain    Home Living Family/patient expects to be discharged to:: Private residence Living Arrangements: Non-relatives/Friends Available Help at Discharge: Friend(s);Available PRN/intermittently Type of Home: House(lives in basement) Home Access: Stairs to enter Entrance Stairs-Rails: Left Entrance Stairs-Number of Steps: 25 Home Layout: Two level   Additional Comments: lives in basement bedroom of friend, bathroom upstairs,     Prior Function Level of Independence: Independent         Comments: (on disability, recent DUI, trying to get into Rehab)     Hand Dominance        Extremity/Trunk Assessment   Upper Extremity Assessment Upper Extremity Assessment: Overall WFL for tasks assessed    Lower Extremity Assessment Lower Extremity Assessment: Overall WFL for tasks  assessed       Communication   Communication: No difficulties  Cognition Arousal/Alertness: Awake/alert Behavior During Therapy: WFL for tasks assessed/performed Overall Cognitive Status: Within Functional Limits for tasks assessed                                         General Comments General comments (skin integrity, edema, etc.): VSS         Assessment/Plan    PT Assessment Patient needs continued PT services  PT Problem List Decreased strength;Decreased activity tolerance;Decreased safety awareness;Decreased balance       PT Treatment Interventions Gait training;Stair training;Functional mobility training;Therapeutic activities;Balance training;Therapeutic exercise;Patient/family education    PT Goals (Current goals can be found in the Care Plan section)  Acute Rehab PT Goals Patient Stated Goal: get into Rehab for recovery from alcoholism  PT Goal Formulation: With patient Time For Goal Achievement: 12/14/17 Potential to Achieve Goals: Good    Frequency Min 3X/week   Barriers to discharge Decreased caregiver support      Co-evaluation PT/OT/SLP Co-Evaluation/Treatment: Yes Reason for Co-Treatment: Complexity of the patient's impairments (multi-system involvement) PT goals addressed during session: Mobility/safety with mobility         AM-PAC PT "6 Clicks" Daily Activity  Outcome Measure Difficulty turning over in bed (including adjusting bedclothes, sheets and blankets)?: None Difficulty moving from lying on back to sitting on the side of the bed? : Unable Difficulty sitting down on and standing up from a chair with arms (e.g., wheelchair, bedside commode, etc,.)?: Unable Help needed moving to and from a bed to chair (including a wheelchair)?: A Little Help needed walking in hospital room?: A Little Help needed climbing 3-5 steps with a railing? : A Little 6 Click Score: 15    End of Session Equipment Utilized During Treatment: Gait belt Activity Tolerance: Patient tolerated treatment well Patient left: in chair;with call bell/phone within reach;with chair alarm set Nurse Communication: Mobility status PT Visit Diagnosis: Unsteadiness on feet (R26.81);History of falling (Z91.81)    Time:  5409-8119 PT Time Calculation (min) (ACUTE ONLY): 26 min   Charges:   PT Evaluation $PT Eval Moderate Complexity: 1 Mod          Kristopher Barker B. Beverely Risen PT, DPT Acute Rehabilitation  [phone] Pager [phone]    [name] 11/30/2017, 1:13 PM

## 2017-11-30 NOTE — Progress Notes (Signed)
Initial Nutrition Assessment  DOCUMENTATION CODES:   Not applicable  INTERVENTION:    Boost Breeze po TID, each supplement provides 250 kcal and 9 grams of protein  NUTRITION DIAGNOSIS:   Increased nutrient needs related to chronic illness(alcoholic liver disease) as evidenced by estimated needs  GOAL:   Patient will meet greater than or equal to 90% of their needs  MONITOR:   PO intake, Supplement acceptance, Labs, Weight trends, Skin, I & O's  REASON FOR ASSESSMENT:   Consult Assessment of nutrition requirement/status  ASSESSMENT:    42 yo Male who presented with dizziness.  PMH significant for alcoholic liver disease, A Fib recently taken off coumadin for multiple falls.   RD spoke with patient at bedside. Breakfast was untouched on his tray table. He stated "I wasn't hungry". Pt shares his PO intake was variable PTA. On "good" days he would eat 3 meals.  However on "bad" days he states he would feel "awful" and be in bed all day. His weight fluctuates between 205-215 lbs from fluid accumulation and/or removal. Pt does not drink any oral nutrition supplements at home.  Pt amenable to Boost Breeze supplements here. Doesn't tolerate milk products. On CIWA Protocol; receiving MVI, thiamine and folvite daily. Labs reviewed. Na 121 (L). Cl 88 (L).  NUTRITION - FOCUSED PHYSICAL EXAM:  Completed. No muslce or fat depletions noticed.  Diet Order:   Diet Order           Diet regular Room service appropriate? Yes; Fluid consistency: Thin  Diet effective now         EDUCATION NEEDS:   No education needs have been identified at this time  Skin:  Skin Assessment: Reviewed RN Assessment  Last BM:  7/29  Height:   Ht Readings from Last 1 Encounters:  11/29/17 6\' 1"  (1.854 m)   Weight:   Wt Readings from Last 1 Encounters:  11/29/17 205 lb (93 kg)   Ideal Body Weight:  83.6 kg  BMI:  Body mass index is 27.05 kg/m.  Estimated Nutritional Needs:    Kcal:  2300-2500  Protein:  120-135 gm  Fluid:  2.3-2.5 L  Maureen ChattersKatie Armanii Urbanik, RD, LDN Pager #: [phone] After-Hours Pager #: [phone]

## 2017-11-30 NOTE — Evaluation (Signed)
Occupational Therapy Evaluation Patient Details Name: Kristopher BellsJeffrey D Arno MRN: [id number] DOB: 1975/05/20 Today's Date: 11/30/2017    History of Present Illness Pt is a 42 year old man with hx of alcohol abuse, liver disease, afib and multiple falls admitted 11/29/17 with dizziness and hyponatremia.    Clinical Impression   Pt is typically independent in self care and mobility. He presents with impaired standing balance and reported pt upon initially sitting. Pt is currently functioning at a min guard to min assist level with standing ADL. His goal is to receive treatment for his alcoholism. Will follow acutely. Do not anticipate pt will need post acute OT. Pt with HR to 121 with ambulation.     Follow Up Recommendations  No OT follow up    Equipment Recommendations  None recommended by OT    Recommendations for Other Services       Precautions / Restrictions Precautions Precautions: Fall Restrictions Weight Bearing Restrictions: No      Mobility Bed Mobility Overal bed mobility: Needs Assistance Bed Mobility: Supine to Sit     Supine to sit: Min guard     General bed mobility comments: min guard for safety, increased time   Transfers Overall transfer level: Needs assistance Equipment used: None Transfers: Sit to/from Stand Sit to Stand: Min guard         General transfer comment: min guard for safety    Balance Overall balance assessment: Needs assistance Sitting-balance support: Feet supported;No upper extremity supported Sitting balance-Leahy Scale: Good     Standing balance support: During functional activity;No upper extremity supported Standing balance-Leahy Scale: Fair               High level balance activites: Head turns High Level Balance Comments: min guard for ambulation with head turns, decrease in velocity and drifting           ADL either performed or assessed with clinical judgement   ADL                                          General ADL Comments: pt slightly unsteady in standing requiring min to min guard assist for standing ADL     Vision Baseline Vision/History: No visual deficits Patient Visual Report: No change from baseline       Perception     Praxis      Pertinent Vitals/Pain Pain Assessment: No/denies pain     Hand Dominance Right   Extremity/Trunk Assessment Upper Extremity Assessment Upper Extremity Assessment: Overall WFL for tasks assessed   Lower Extremity Assessment Lower Extremity Assessment: Overall WFL for tasks assessed       Communication Communication Communication: No difficulties   Cognition Arousal/Alertness: Awake/alert Behavior During Therapy: WFL for tasks assessed/performed Overall Cognitive Status: Within Functional Limits for tasks assessed                                     General Comments  VSS     Exercises     Shoulder Instructions      Home Living Family/patient expects to be discharged to:: Private residence Living Arrangements: Non-relatives/Friends Available Help at Discharge: Friend(s);Available PRN/intermittently Type of Home: House(lives in basement) Home Access: Stairs to enter Entrance Stairs-Number of Steps: 25 Entrance Stairs-Rails: Left Home Layout: Two level Alternate Level Stairs-Number  of Steps: 15 Alternate Level Stairs-Rails: Right Bathroom Shower/Tub: Producer, television/film/video: Standard     Home Equipment: None   Additional Comments: lives in basement bedroom of friend,  full bathroom upstairs, half in basement       Prior Functioning/Environment Level of Independence: Independent        Comments: pt with recent DUI, on disability for cirrhosis        OT Problem List: Impaired balance (sitting and/or standing)      OT Treatment/Interventions: Self-care/ADL training;Balance training    OT Goals(Current goals can be found in the care plan section) Acute Rehab OT  Goals Patient Stated Goal: get into Rehab for recovery from alcoholism  OT Goal Formulation: With patient Time For Goal Achievement: 12/07/17 Potential to Achieve Goals: Good ADL Goals Pt Will Perform Grooming: Independently;standing Pt Will Perform Lower Body Bathing: Independently;sit to/from stand Pt Will Perform Lower Body Dressing: Independently;sit to/from stand Pt Will Transfer to Toilet: Independently;ambulating;regular height toilet Pt Will Perform Toileting - Clothing Manipulation and hygiene: Independently;sit to/from stand Pt Will Perform Tub/Shower Transfer: Independently;ambulating;Shower transfer  OT Frequency: Min 2X/week   Barriers to D/C:            Co-evaluation   Reason for Co-Treatment: Complexity of the patient's impairments (multi-system involvement) PT goals addressed during session: Mobility/safety with mobility        AM-PAC PT "6 Clicks" Daily Activity     Outcome Measure Help from another person eating meals?: None Help from another person taking care of personal grooming?: A Little Help from another person toileting, which includes using toliet, bedpan, or urinal?: A Little Help from another person bathing (including washing, rinsing, drying)?: A Little Help from another person to put on and taking off regular upper body clothing?: None Help from another person to put on and taking off regular lower body clothing?: A Little 6 Click Score: 20   End of Session Equipment Utilized During Treatment: Gait belt  Activity Tolerance: Patient tolerated treatment well Patient left: in chair;with call bell/phone within reach;with chair alarm set  OT Visit Diagnosis: Unsteadiness on feet (R26.81);Other abnormalities of gait and mobility (R26.89);History of falling (Z91.81)                Time: 1610-9604 OT Time Calculation (min): 26 min Charges:  OT General Charges $OT Visit: 1 Visit OT Evaluation $OT Eval Moderate Complexity: 1 Mod  {Kearney Evitt, [place] Bailiff 11/30/2017, 2:30 PM  11/30/2017 Martie Round, OTR/L Pager: [phone]

## 2017-11-30 NOTE — Discharge Summary (Signed)
Family Medicine [facility] AMA Summary  Patient name: Kristopher BellsJeffrey D Raine Medical record number: [id number] Date of birth: December 02, 1975 Age: 42 y.o. Gender: male Date of Admission: 11/29/2017  Date of AMA Leave: 12/01/2017 Admitting Physician: Doreene ElandKehinde T Eniola, MD  Primary Care Provider: Patient, No Pcp Per Consultants: PT/OT, CSW  Indication for Hospitalization: Hyponatremia  AMA Diagnoses/Problem List:  Alcoholic Liver Disease Hyponatremia Leukocytosis AKI Chest Pain Protein Calorie Malnutrition Melena  Tobacco abuse Atrial fibrillation  Disposition: Home, AMA  AMA Condition: Stable, requiring further medical intervention  AMA Exam:   Physical Exam: General: 42 y.o. male in NAD, A&Ox3 Cardio: RRR no m/r/g Lungs: CTAB, no wheezing, no rhonchi, no crackles Abdomen: Soft, non-tender to palpation, positive bowel sounds Skin: warm and dry Extremities: No edema Neuro: Grossly neurologically intact   Brief Hospital Course:  Kristopher BellsJeffrey D Southall is a 42 y.o. male presented to the ED with severe dizziness.  Patient has a history of alcoholic liver disease and drinks 1/5 of liquor a day.  He requires 2 paracentesis a month, his last was 7/22.  A CT head was performed at the time of presentation to the ED, which showed no acute bleed.  Patient also had multiple bruises on his legs from recent falls for which x-rays were performed, that were negative for any acute fracture.  He also has a history of multiple admissions for hyponatremia.  [facility]  On chart review his baseline sodium appears to be 123-126.  On presentation to the ED, patient was found to have a sodium of 119.  He also had an AKI with a creatinine of 1.58, baseline of about 0.6-0.7 and lactic acidosis.  Patient noted that he had not been eating or drinking aside from alcohol for about 6 days.  He denied any abdominal pain at the time of admission, and lipase was  within normal limits making pancreatitis unlikely.  Hospitalization, patient was given IV fluids.  Sodium continued to correct, and was 122 at the time patient decided to leave AMA.  At this time patient was requiring more repletion of his sodium.  The plan was to continue to replete his sodium.  It was explained to the patient that this has to happen slowly in order to avoid cerebral pontine myelinolysis.  The risks of the patient leaving at that time were discussed fully, including possible risk of death.  Patient was alert and oriented x3 and had capacity to make medical decisions.  He voiced understanding of the risks, including death, of leaving at this time.  Patient made the decision to leave AMA.  He was encouraged to stop using alcohol and seek treatment at a rehab facility.  He was also encouraged to follow-up with a doctor as soon as possible for continued treatment.  During his hospitalization, patient was monitored using CIWA protocol and was treated with Ativan appropriately for withdrawal symptoms.  The patient's withdrawal was well controlled throughout his admission.  Patient's albumin was also decreased to 2.5 secondary to protein calorie malnutrition from alcoholism.  Nutrition consult was placed and recommended that the patient boost nutrition supplements.  On presentation he complained of mild left-sided chest pain.  Troponins were trended with the first being 0.03 and next 2 values were negative.  The patient's chest pain resolved spontaneously.  He had also noted melena for quite some time.  He has seen GI and had a recent colonoscopy.  His hemoglobin was trending down at the  time patient left AMA.  It was recommended that the patient stated to continue to monitor this, but patient refused.  He will need further follow-up with a GI doctor.  Issues for Follow Up:  1. Will need follow-up BMP for sodium and creatinine with possible further correction. 2. Will need further follow-up  for alcohol cessation and compliance with rehab. 3. Further follow-up with GI for melena. 4. Patient was taken off his anticoagulation due to history of multiple falls.  He will need further follow-up with cardiology and will possibly need to restart anticoagulation pending his ability to remain sober.  Significant Procedures: None  Significant Labs and Imaging:  Recent Labs  Lab 11/29/17 1050 11/30/17 0342 11/30/17 2357  WBC 18.1* 16.9* 14.2*  HGB 13.3 11.8* 11.6*  HCT 36.8* 32.8* 32.7*  PLT 208 144* 150   Recent Labs  Lab 11/29/17 1050  11/30/17 0342 11/30/17 0749 11/30/17 1239 11/30/17 1633 11/30/17 2357 12/01/17 0744  NA 119*   < > 120* 119* 121* 122* 122* 122*  K 3.6   < > 4.3 5.2* 4.0 4.3 3.7 3.9  CL 84*   < > 88* 88* 88* 90* 89* 92*  CO2 23   < > 25 21* 22 25 25 22   GLUCOSE 101*   < > 86 84 83 87 119* 106*  BUN 10   < > 11 12 11 11 13 12   CREATININE 1.58*   < > 1.71* 1.80* 1.78* 1.79* 1.83* 1.89*  CALCIUM 10.0   < > 9.2 9.0 9.1 8.7* 8.7* 8.6*  MG 1.2*  --  1.8  --   --   --   --   --   ALKPHOS 421*  --  379*  --   --   --   --   --   AST 121*  --  115*  --   --   --   --   --   ALT 36  --  34  --   --   --   --   --   ALBUMIN 2.5*  --  2.2*  --   --   --   --   --    < > = values in this interval not displayed.   Lactic Acid 2.16>1.6>2.1  Dg Thoracic Spine 2 View  Result Date: 11/29/2017 CLINICAL DATA:  Fall onto grass.  Patient could not get up. EXAM: THORACIC SPINE 2 VIEWS COMPARISON:  None. FINDINGS: There is no evidence of thoracic spine fracture. Alignment is normal. Levels of disc narrowing and endplate spurring. IMPRESSION: Negative. Electronically Signed   By: Marnee Spring M.D.   On: 11/29/2017 13:01   Ct Head Wo Contrast  Result Date: 11/29/2017 CLINICAL DATA:  42 year old male with acute headache and neck pain following multiple recent falls. EXAM: CT HEAD WITHOUT CONTRAST CT CERVICAL SPINE WITHOUT CONTRAST TECHNIQUE: Multidetector CT imaging of  the head and cervical spine was performed following the standard protocol without intravenous contrast. Multiplanar CT image reconstructions of the cervical spine were also generated. COMPARISON:  None. FINDINGS: CT HEAD FINDINGS Brain: No evidence of acute infarction, hemorrhage, hydrocephalus, extra-axial collection or mass lesion/mass effect. Mild volume loss noted. Vascular: Mild carotid atherosclerotic calcifications noted. Skull: Normal. Negative for fracture or focal lesion. Sinuses/Orbits: No acute finding. Other: None. CT CERVICAL SPINE FINDINGS Alignment: Normal. Skull base and vertebrae: No acute fracture. No primary bone lesion or focal pathologic process. Soft tissues and spinal canal: No prevertebral fluid or  swelling. No visible canal hematoma. Disc levels: RIGHT facet arthropathy at C3-4 causes mild bony foraminal narrowing. Upper chest: Negative. Other: None IMPRESSION: 1. No evidence of acute intracranial abnormality 2. No static evidence of acute injury to the cervical spine. Electronically Signed   By: Harmon Pier M.D.   On: 11/29/2017 12:52   Ct Cervical Spine Wo Contrast  Result Date: 11/29/2017 CLINICAL DATA:  42 year old male with acute headache and neck pain following multiple recent falls. EXAM: CT HEAD WITHOUT CONTRAST CT CERVICAL SPINE WITHOUT CONTRAST TECHNIQUE: Multidetector CT imaging of the head and cervical spine was performed following the standard protocol without intravenous contrast. Multiplanar CT image reconstructions of the cervical spine were also generated. COMPARISON:  None. FINDINGS: CT HEAD FINDINGS Brain: No evidence of acute infarction, hemorrhage, hydrocephalus, extra-axial collection or mass lesion/mass effect. Mild volume loss noted. Vascular: Mild carotid atherosclerotic calcifications noted. Skull: Normal. Negative for fracture or focal lesion. Sinuses/Orbits: No acute finding. Other: None. CT CERVICAL SPINE FINDINGS Alignment: Normal. Skull base and vertebrae:  No acute fracture. No primary bone lesion or focal pathologic process. Soft tissues and spinal canal: No prevertebral fluid or swelling. No visible canal hematoma. Disc levels: RIGHT facet arthropathy at C3-4 causes mild bony foraminal narrowing. Upper chest: Negative. Other: None IMPRESSION: 1. No evidence of acute intracranial abnormality 2. No static evidence of acute injury to the cervical spine. Electronically Signed   By: Harmon Pier M.D.   On: 11/29/2017 12:52   Dg Knee Complete 4 Views Right  Result Date: 11/29/2017 CLINICAL DATA:  Fall landing on grass. EXAM: RIGHT KNEE - COMPLETE 4+ VIEW COMPARISON:  None. FINDINGS: No evidence of fracture, dislocation, or joint effusion. No evidence of arthropathy or other focal bone abnormality. Soft tissues are unremarkable. IMPRESSION: Negative. Electronically Signed   By: Marnee Spring M.D.   On: 11/29/2017 12:59     Results/Tests Pending at Time of Discharge: None  AMA Medications:  Allergies as of 12/01/2017   No Known Allergies     Medication List    ASK your doctor about these medications   calcium carbonate 750 MG chewable tablet Commonly known as:  TUMS EX Chew 2 tablets by mouth daily as needed for heartburn (stomach acid).   furosemide 40 MG tablet Commonly known as:  LASIX Take 40 mg by mouth 2 (two) times daily.   spironolactone 100 MG tablet Commonly known as:  ALDACTONE Take 100 mg by mouth daily.   warfarin 5 MG tablet Commonly known as:  COUMADIN Take 5 mg by mouth as directed.       AMA Instructions: Please refer to Patient Instructions section of EMR for full details.  Patient was counseled important signs and symptoms that should prompt return to medical care, changes in medications, dietary instructions, activity restrictions, and follow up appointments.   Follow-Up Appointments:   Unknown Jim, DO 12/03/2017, 8:57 AM PGY-1, [facility] Family [facility]

## 2017-11-30 NOTE — Progress Notes (Signed)
PHARMACIST - PHYSICIAN COMMUNICATION  DR:    CONCERNING: IV to Oral Route Change Policy  RECOMMENDATION: This patient is receiving folic acid by the intravenous route.  Based on criteria approved by the Pharmacy and Therapeutics Committee, the intravenous medication(s) is/are being converted to the equivalent oral dose form(s).   DESCRIPTION: These criteria include:  The patient is eating (either orally or via tube) and/or has been taking other orally administered medications for a least 24 hours  The patient has no evidence of active gastrointestinal bleeding or impaired GI absorption (gastrectomy, short bowel, patient on TNA or NPO).  If you have questions about this conversion, please contact the Pharmacy Department  []   [phone]( [phone] )  [facility] []   [phone]( [phone] )  [facility] [x]   [phone]( [phone] )  [facility] []   [phone]( [phone] )  [facility] []   [phone]( [phone] )  [facility]   Ephr[place]h Pham, [place] 11/30/2017 8:41 AM

## 2017-12-01 LAB — BASIC METABOLIC PANEL
ANION GAP: 8 (ref 5–15)
ANION GAP: 8 (ref 5–15)
BUN: 13 mg/dL (ref 6–20)
BUN: 12 mg/dL (ref 6–20)
CALCIUM: 8.7 mg/dL — AB (ref 8.9–10.3)
CHLORIDE: 89 mmol/L — AB (ref 98–111)
CHLORIDE: 92 mmol/L — AB (ref 98–111)
CO2: 25 mmol/L (ref 22–32)
CO2: 22 mmol/L (ref 22–32)
Calcium: 8.6 mg/dL — ABNORMAL LOW (ref 8.9–10.3)
Creatinine, Ser: 1.83 mg/dL — ABNORMAL HIGH (ref 0.61–1.24)
Creatinine, Ser: 1.89 mg/dL — ABNORMAL HIGH (ref 0.61–1.24)
GFR calc Af Amer: 51 mL/min — ABNORMAL LOW (ref >60)
GFR calc Af Amer: 49 mL/min — ABNORMAL LOW (ref >60)
GFR calc non Af Amer: 44 mL/min — ABNORMAL LOW (ref >60)
GFR calc non Af Amer: 42 mL/min — ABNORMAL LOW (ref >60)
GLUCOSE: 106 mg/dL — AB (ref 70–99)
Glucose, Bld: 119 mg/dL — ABNORMAL HIGH (ref 70–99)
POTASSIUM: 3.9 mmol/L (ref 3.5–5.1)
Potassium: 3.7 mmol/L (ref 3.5–5.1)
SODIUM: 122 mmol/L — AB (ref 135–145)
Sodium: 122 mmol/L — ABNORMAL LOW (ref 135–145)

## 2017-12-01 LAB — CBC
HEMATOCRIT: 32.7 % — AB (ref 39.0–52.0)
Hemoglobin: 11.6 g/dL — ABNORMAL LOW (ref 13.0–17.0)
MCH: 34.9 pg — ABNORMAL HIGH (ref 26.0–34.0)
MCHC: 35.5 g/dL (ref 30.0–36.0)
MCV: 98.5 fL (ref 78.0–100.0)
Platelets: 150 10*3/uL (ref 150–400)
RBC: 3.32 MIL/uL — AB (ref 4.22–5.81)
RDW: 15.5 % (ref 11.5–15.5)
WBC: 14.2 10*3/uL — AB (ref 4.0–10.5)

## 2017-12-01 MED ORDER — SODIUM CHLORIDE 0.9 % IV SOLN: INTRAVENOUS | Status: DC

## 2017-12-01 NOTE — Progress Notes (Signed)
Patient expressed desire to leave AMA. This RN spoke with the patient and educated him about leaving AMA. He acknowledges understanding. Treatment team paged and notified.

## 2017-12-01 NOTE — Care Management Note (Signed)
Case Management Note  Patient Details  Name: Kristopher Barker MRN: [id number] Date of Birth: [birth year]  Subjective/Objective:   Presents withhyponatremia, fall, cirrhosis, etoh, withdrawing .  Left AMA 7/30.                 Action/Plan: Left AMA.  Expected Discharge Date:                  Expected Discharge Plan:  Home/Self Care  In-House Referral:     Discharge planning Services  CM Consult  Post Acute Care Choice:    Choice offered to:     DME Arranged:    DME Agency:     HH Arranged:    HH Agency:     Status of Service:  Completed, signed off  If discussed at [employer] Length of Stay Meetings, dates discussed:    Additional Comments:  Leone Havenaylor, Ondria Oswald Clinton, RN 12/01/2017, 10:30 AM

## 2017-12-02 NOTE — Progress Notes (Addendum)
Late note entry:  Patient seen and evaluated by me. He stated that he feels much better. He was requesting to go home. Physical exam benign and his Na level improved some. As discussed with him, we need his sodium level to go up a bit prior to d/c home. We will f/u on morning lab result.

## 2018-02-02 DEATH — deceased

## 2018-03-05 DEATH — deceased

## 2019-12-05 IMAGING — DX DG THORACIC SPINE 2V
3 series · 3 of 3 positions shown · non-contrast
Comparison: None.

CLINICAL DATA: Fall onto grass.  Patient could not get up.

EXAM:
THORACIC SPINE 2 VIEWS

[t-spine ap]
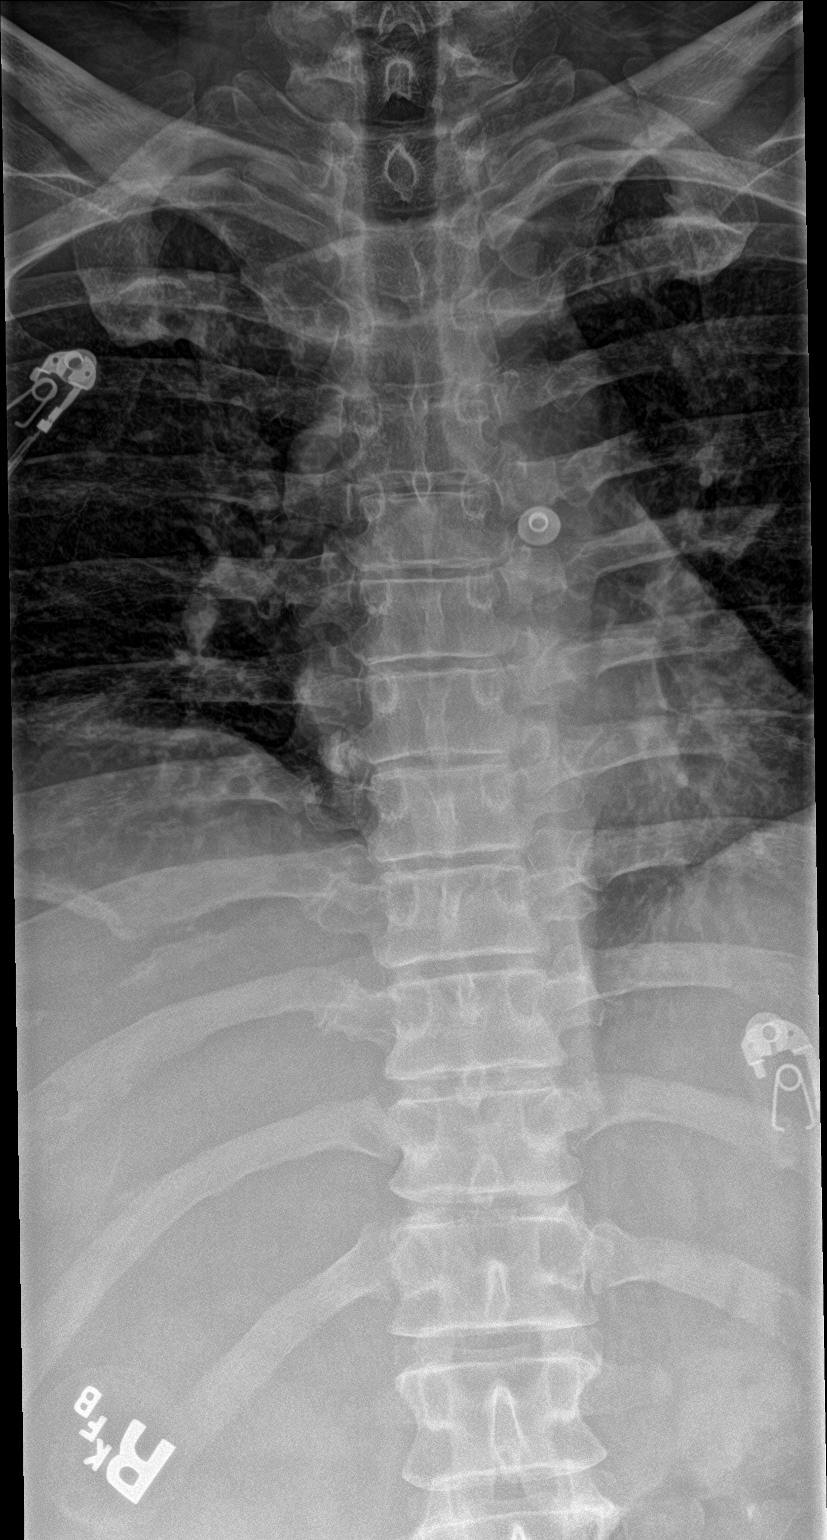

[t-spine lat]
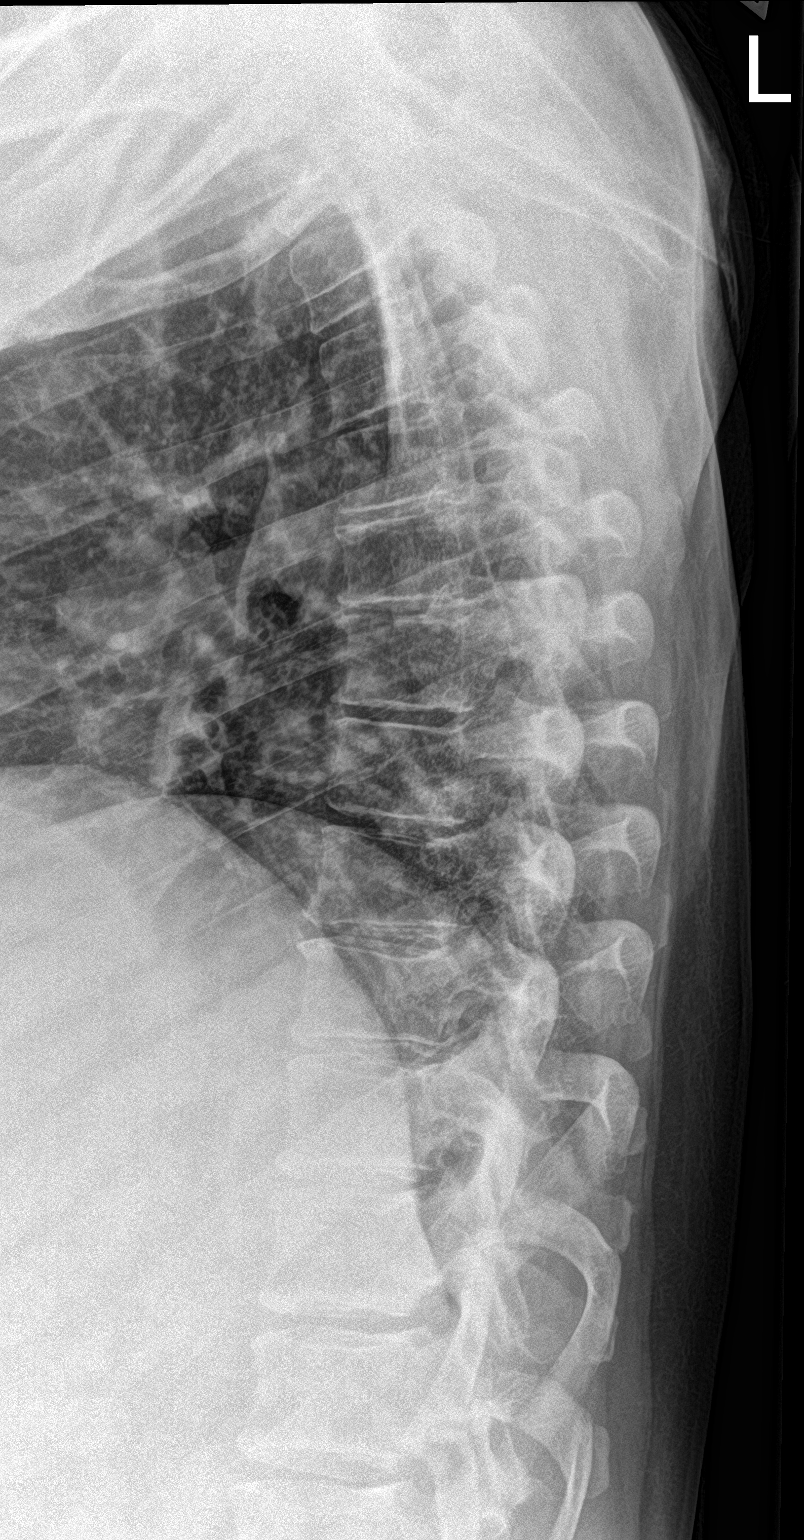

[t-spine swimmers]
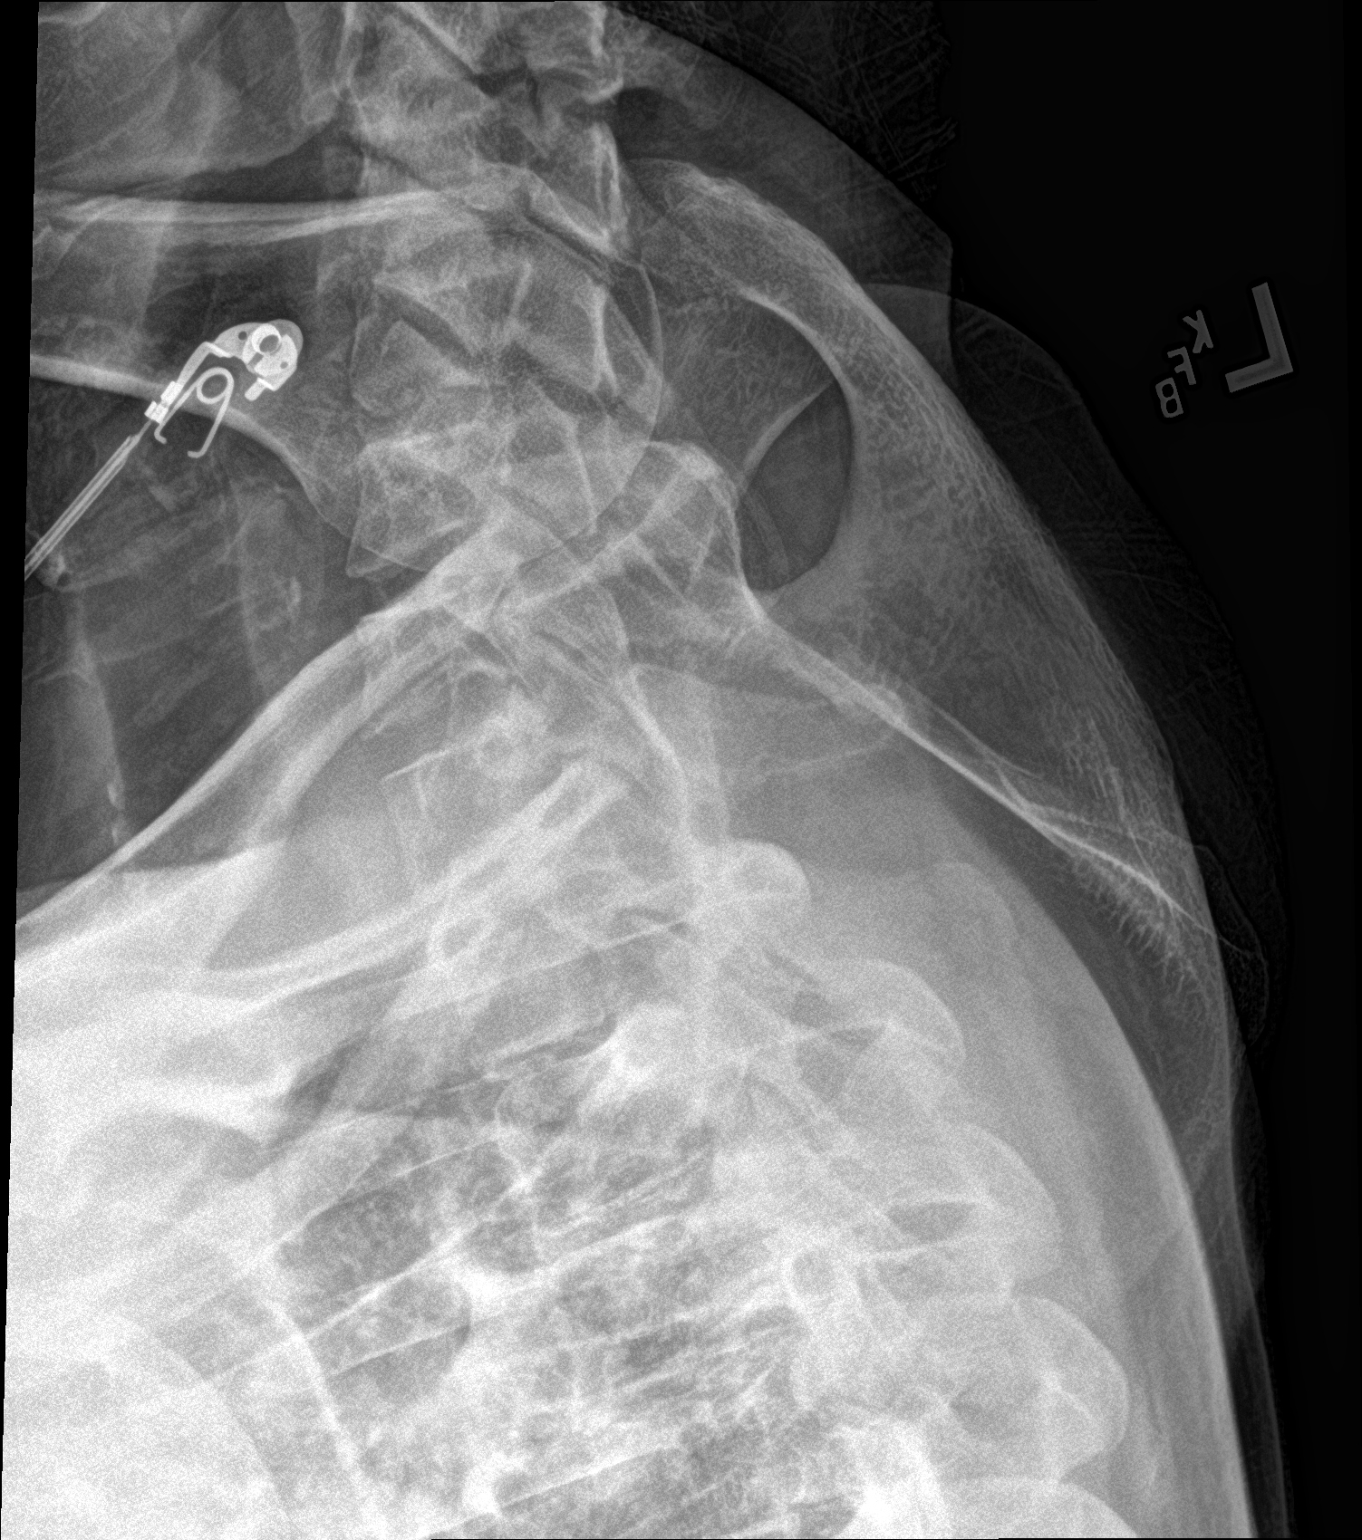

[3 of 3 positions shown; findings below may reference images not displayed]

FINDINGS: There is no evidence of thoracic spine fracture. Alignment is
normal. Levels of disc narrowing and endplate spurring.
IMPRESSION: Negative.

## 2019-12-05 IMAGING — CT CT CERVICAL SPINE W/O CM
5 of 8 series · 13 of 33 positions shown, 14 images · non-contrast
Comparison: None.

CLINICAL DATA: 41-year-old male with acute headache and neck pain
following multiple recent falls.

EXAM:
CT HEAD WITHOUT CONTRAST
CT CERVICAL SPINE WITHOUT CONTRAST
TECHNIQUE: Multidetector CT imaging of the head and cervical spine was
performed following the standard protocol without intravenous
contrast. Multiplanar CT image reconstructions of the cervical spine
were also generated.

[Series 5: head bone · axial · 0.46mm/px · z∈[+428,+482]mm · 2 of 83 slices shown]
[im 28/83  bone]
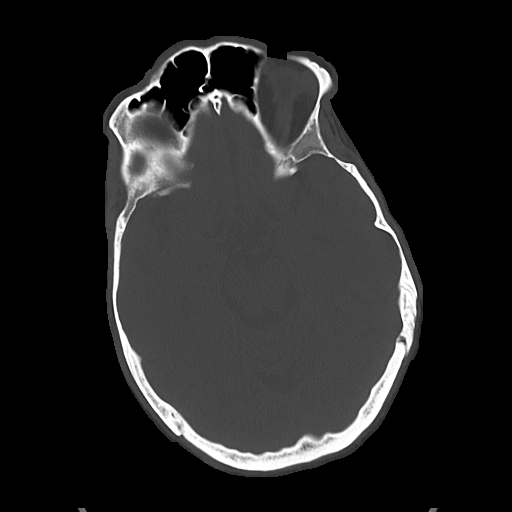
[im 55/83  bone]
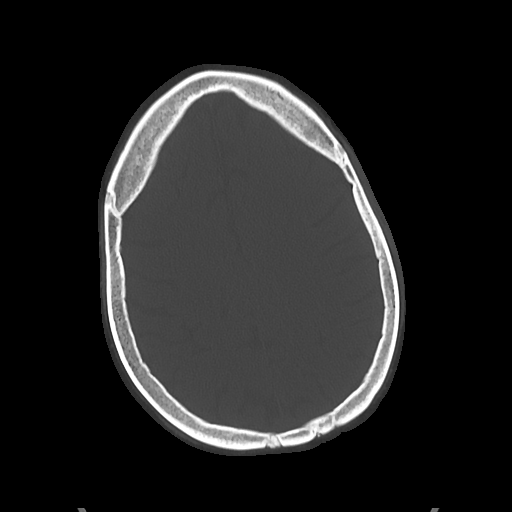

[Series 6: cor soft · coronal · 0.33mm/px · 2 of 69 slices shown]
[im 23/69  bone]
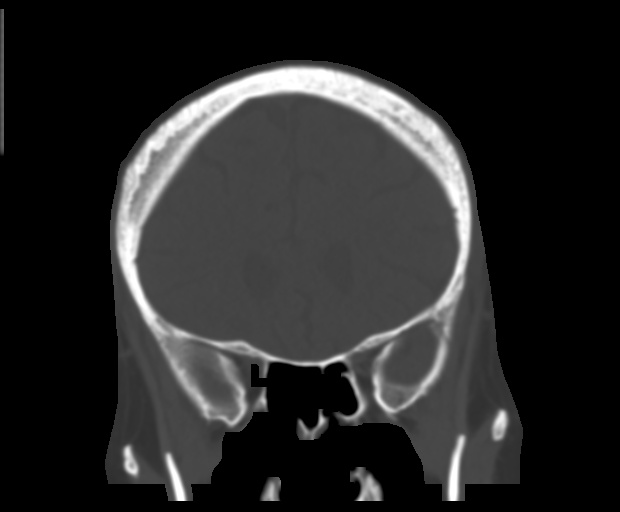
[im 46/69  bone]
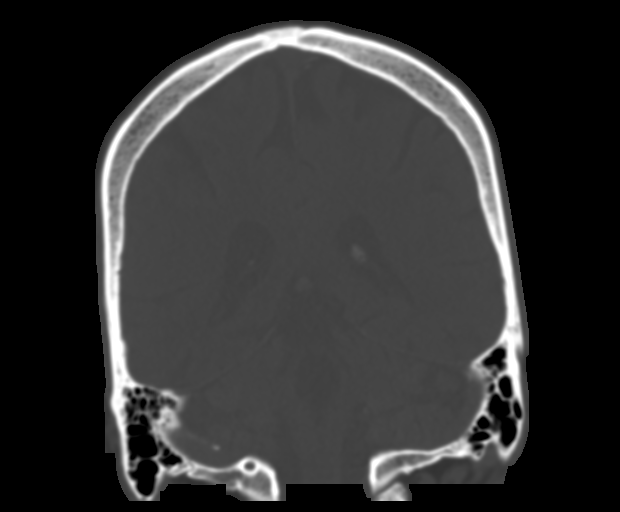

[Series 9: c spine soft · axial · 0.31mm/px · z∈[+272,+380]mm · 3 of 109 slices shown]
[im 28/109  soft-tissue]
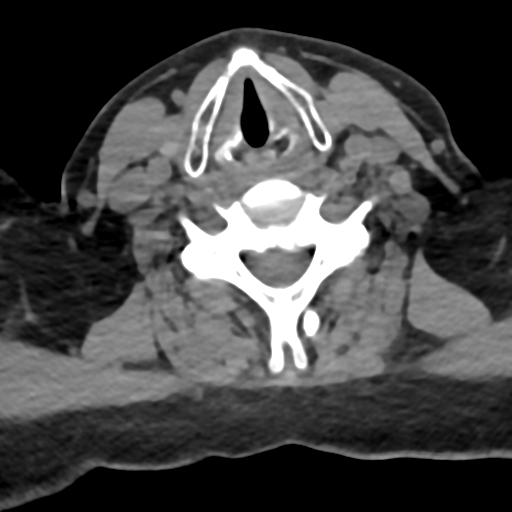
[im 55/109  soft-tissue]
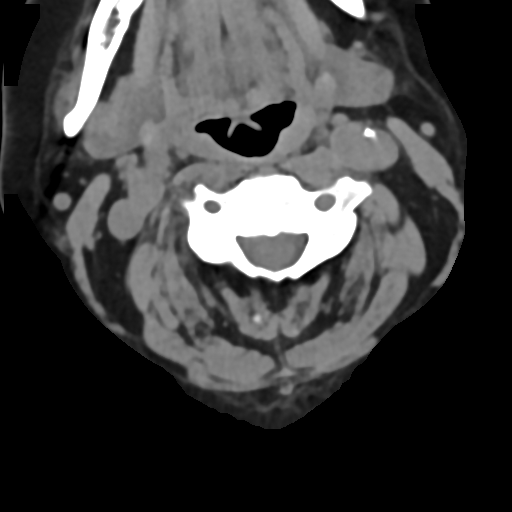
[im 82/109  soft-tissue]
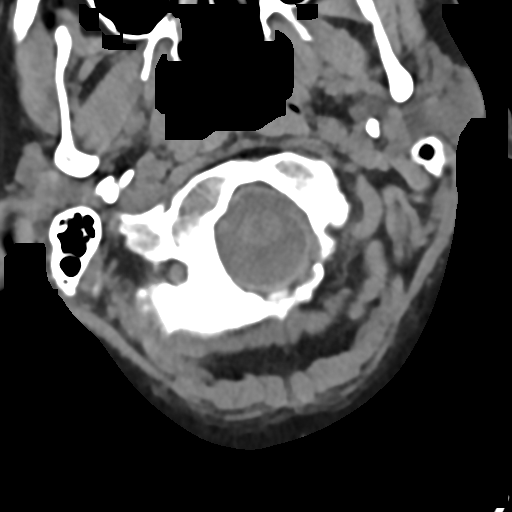

[Series 10: sag bone · sagittal · 0.39mm/px · 4 of 47 slices shown]
[im 10/47  bone]
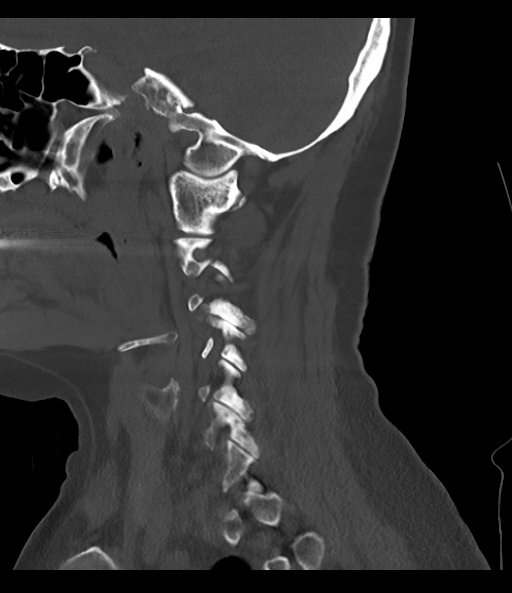
[im 19/47  bone]
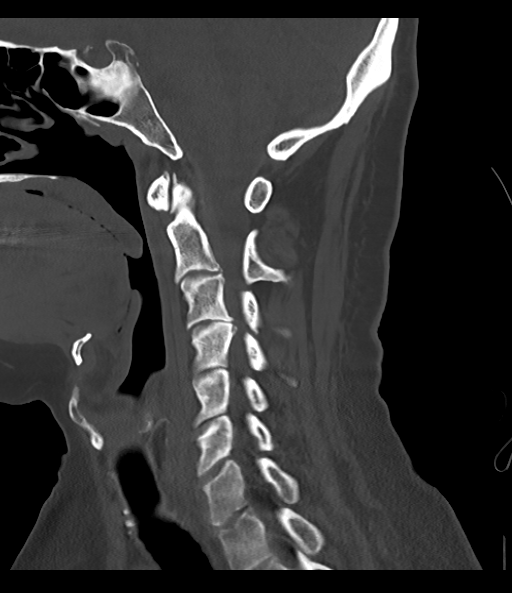
[im 28/47  bone]
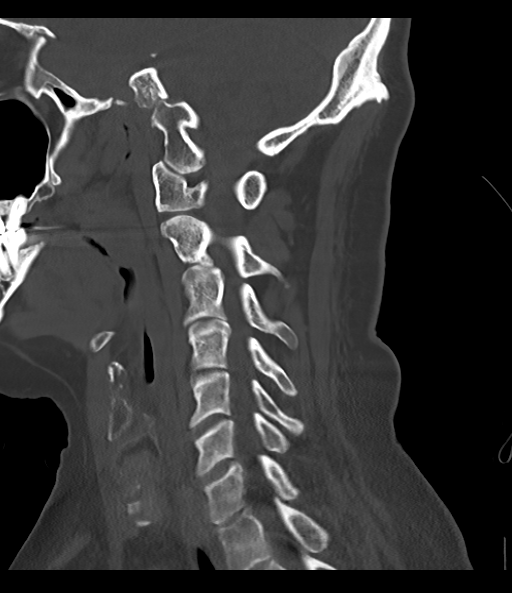
[im 37/47  bone]
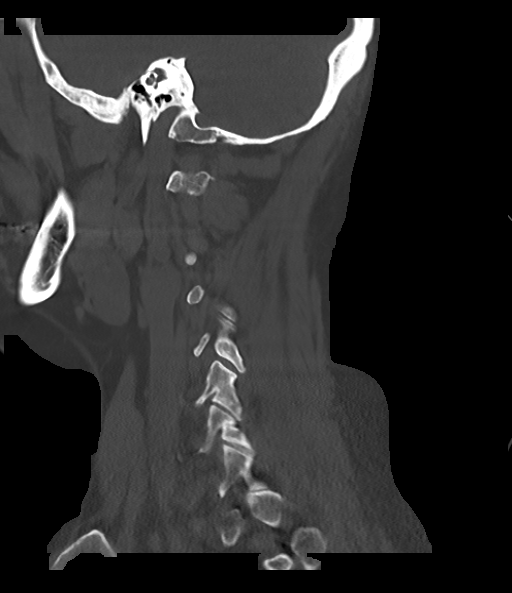

[Series 12: orthogonal axials · axial · 0.21mm/px · z∈[+256,+311]mm · 2 of 88 slices shown, 3 images]
[im 30/88  soft-tissue]
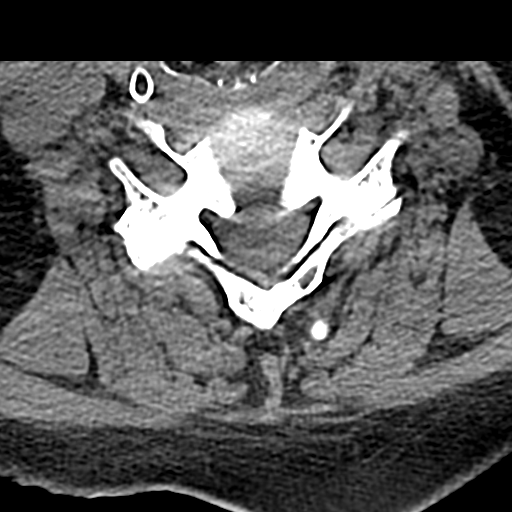
[im 30/88  bone]
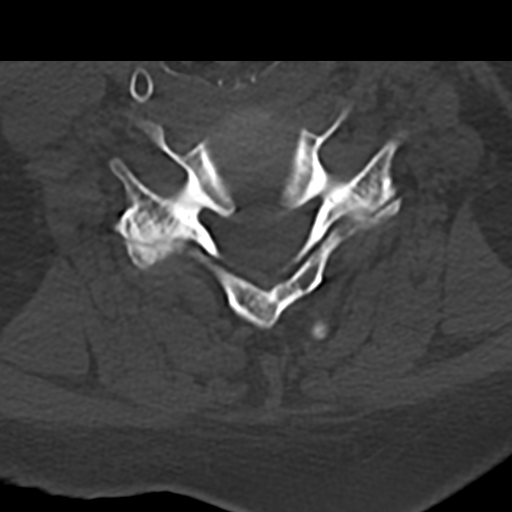
[im 59/88  bone]
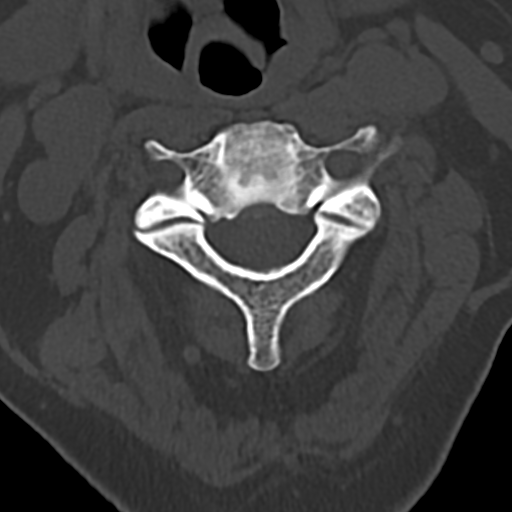

[13 of 33 positions shown; findings below may reference images not displayed]

FINDINGS: CT HEAD FINDINGS

Brain: No evidence of acute infarction, hemorrhage, hydrocephalus,
extra-axial collection or mass lesion/mass effect.

Mild volume loss noted.

Vascular: Mild carotid atherosclerotic calcifications noted.

Skull: Normal. Negative for fracture or focal lesion.

Sinuses/Orbits: No acute finding.

Other: None.

CT CERVICAL SPINE FINDINGS

Alignment: Normal.

Skull base and vertebrae: No acute fracture. No primary bone lesion
or focal pathologic process.

Soft tissues and spinal canal: No prevertebral fluid or swelling. No
visible canal hematoma.

Disc levels: RIGHT facet arthropathy at C3-4 causes mild bony
foraminal narrowing.

Upper chest: Negative.

Other: None
IMPRESSION: 1. No evidence of acute intracranial abnormality
2. No static evidence of acute injury to the cervical spine.
